# Patient Record
Sex: Male | Born: 1945
Health system: Southern US, Community
[De-identification: ages and names within clinical notes are randomized; demographics above are authoritative.]

## PROBLEM LIST (undated history)

## (undated) DIAGNOSIS — C61 Malignant neoplasm of prostate: Secondary | ICD-10-CM

## (undated) DIAGNOSIS — E7800 Pure hypercholesterolemia, unspecified: Secondary | ICD-10-CM

## (undated) HISTORY — DX: Pure hypercholesterolemia, unspecified: E78.00

## (undated) HISTORY — DX: Malignant neoplasm of prostate: C61

## (undated) HISTORY — PX: APPENDECTOMY: SHX54

---

## 2013-05-20 DIAGNOSIS — Z125 Encounter for screening for malignant neoplasm of prostate: Secondary | ICD-10-CM | POA: Diagnosis not present

## 2013-05-20 DIAGNOSIS — E781 Pure hyperglyceridemia: Secondary | ICD-10-CM | POA: Diagnosis not present

## 2013-05-20 DIAGNOSIS — E663 Overweight: Secondary | ICD-10-CM | POA: Diagnosis not present

## 2013-05-20 DIAGNOSIS — K219 Gastro-esophageal reflux disease without esophagitis: Secondary | ICD-10-CM | POA: Diagnosis not present

## 2013-06-04 DIAGNOSIS — Z125 Encounter for screening for malignant neoplasm of prostate: Secondary | ICD-10-CM | POA: Diagnosis not present

## 2013-06-04 DIAGNOSIS — K219 Gastro-esophageal reflux disease without esophagitis: Secondary | ICD-10-CM | POA: Diagnosis not present

## 2013-06-04 DIAGNOSIS — E669 Obesity, unspecified: Secondary | ICD-10-CM | POA: Diagnosis not present

## 2013-07-13 DIAGNOSIS — N529 Male erectile dysfunction, unspecified: Secondary | ICD-10-CM | POA: Diagnosis not present

## 2013-07-13 DIAGNOSIS — R972 Elevated prostate specific antigen [PSA]: Secondary | ICD-10-CM | POA: Diagnosis not present

## 2013-07-23 DIAGNOSIS — R972 Elevated prostate specific antigen [PSA]: Secondary | ICD-10-CM | POA: Diagnosis not present

## 2013-07-23 HISTORY — PX: OTHER SURGICAL HISTORY: SHX169

## 2013-08-02 ENCOUNTER — Other Ambulatory Visit (HOSPITAL_COMMUNITY): Payer: Self-pay | Admitting: Urology

## 2013-08-02 DIAGNOSIS — C61 Malignant neoplasm of prostate: Secondary | ICD-10-CM

## 2013-08-06 ENCOUNTER — Encounter (HOSPITAL_COMMUNITY): Payer: Self-pay

## 2013-08-06 ENCOUNTER — Ambulatory Visit (HOSPITAL_COMMUNITY)
Admission: RE | Admit: 2013-08-06 | Discharge: 2013-08-06 | Disposition: A | Discharge status: Home / Self Care | Payer: Medicare Other | Source: Ambulatory Visit | Attending: Urology | Admitting: Urology

## 2013-08-06 ENCOUNTER — Ambulatory Visit (HOSPITAL_COMMUNITY): Admission: RE | Admit: 2013-08-06 | Payer: Medicare Other | Source: Ambulatory Visit

## 2013-08-06 ENCOUNTER — Other Ambulatory Visit (HOSPITAL_COMMUNITY): Payer: Self-pay | Admitting: Urology

## 2013-08-06 DIAGNOSIS — M545 Low back pain, unspecified: Secondary | ICD-10-CM | POA: Diagnosis not present

## 2013-08-06 DIAGNOSIS — C61 Malignant neoplasm of prostate: Secondary | ICD-10-CM

## 2013-08-06 MED ORDER — TECHNETIUM TC 99M MEDRONATE IV KIT
25.0000 | PACK | Freq: Once | INTRAVENOUS | Status: AC | PRN
  Administered 2013-08-06: 25 via INTRAVENOUS

## 2013-08-16 ENCOUNTER — Other Ambulatory Visit (HOSPITAL_COMMUNITY): Payer: Medicare Other

## 2013-08-18 ENCOUNTER — Telehealth: Payer: Self-pay | Admitting: *Deleted

## 2013-08-18 NOTE — Telephone Encounter (Signed)
LVM for patient to call re: his referral from [facility] for the upcoming 9/12 prostate MDC.  Young Berry, RN, BSN, [facility] Prostate Oncology Navigator [phone]

## 2013-08-20 ENCOUNTER — Other Ambulatory Visit (HOSPITAL_COMMUNITY): Payer: Medicare Other

## 2013-08-20 ENCOUNTER — Ambulatory Visit (HOSPITAL_COMMUNITY)
Admission: RE | Admit: 2013-08-20 | Discharge: 2013-08-20 | Disposition: A | Discharge status: Home / Self Care | Payer: Medicare Other | Source: Ambulatory Visit | Attending: Urology | Admitting: Urology

## 2013-08-20 DIAGNOSIS — C61 Malignant neoplasm of prostate: Secondary | ICD-10-CM | POA: Insufficient documentation

## 2013-08-20 LAB — CREATININE, SERUM
Creatinine, Ser: 1.1 mg/dL (ref 0.50–1.35)
GFR calc non Af Amer: 68 mL/min — ABNORMAL LOW (ref >90)

## 2013-08-20 MED ORDER — GADOBENATE DIMEGLUMINE 529 MG/ML IV SOLN: 20.0000 mL | Freq: Once | INTRAVENOUS | Status: AC | PRN

## 2013-08-24 ENCOUNTER — Telehealth: Payer: Self-pay | Admitting: *Deleted

## 2013-08-24 NOTE — Telephone Encounter (Signed)
Called and spoke with patient re: [facility] referral for 9/12 Prostate MDC.  He indicated he could re-arrange his work schedule to attend.  I told him he would be receiving a mailing in the next couple of days with additional information re: the clinic and forms to be completed prior to his visit.   I encouraged him to call me with any questions prior to receiving or after receiving the information.  Young Berry, RN, BSN, [facility] Prostate Oncology Navigator [phone]

## 2013-08-26 DIAGNOSIS — C61 Malignant neoplasm of prostate: Secondary | ICD-10-CM | POA: Diagnosis not present

## 2013-09-02 ENCOUNTER — Encounter: Payer: Self-pay | Admitting: Radiation Oncology

## 2013-09-02 ENCOUNTER — Telehealth: Payer: Self-pay | Admitting: *Deleted

## 2013-09-02 DIAGNOSIS — C61 Malignant neoplasm of prostate: Secondary | ICD-10-CM | POA: Insufficient documentation

## 2013-09-02 NOTE — Progress Notes (Signed)
GU Location of Tumor / Histology: adenocarcinoma of the prostate  If Prostate Cancer, Gleason Score is (4 + 4=8) and PSA is (71.29)  Patient presented in May 2014 with an elevated PSA.  Biopsies of prostate (if applicable) revealed:     Past/Anticipated interventions by urology, if any: active surveillance not an option and surgery is not recommended therefore, patient was referred to Dr. Kathrynn Running by Dr. Su Grand.  Past/Anticipated interventions by medical oncology, if any: None   Weight changes, if any: None noted  Bowel/Bladder complaints, if any: urgency and erectile dysfunction   Nausea/Vomiting, if any: None noted  Pain issues, if any:  Back pain; increased activity noted on 08/20/2013 MRI suspicious for metastatic disease  SAFETY ISSUES:  Prior radiation? NO  Pacemaker/ICD? NO  Possible current pregnancy? NO  Is the patient on methotrexate? NO  Current Complaints / other details:  67 year old male. High volume carcinoma especially on the left. Findings highly suspicious for transcapsular spread and neurovascular involvement. Suspect a nodal metastasis within the periprosthetic fat. Prostate volume: 63.07.

## 2013-09-03 ENCOUNTER — Ambulatory Visit (HOSPITAL_BASED_OUTPATIENT_CLINIC_OR_DEPARTMENT_OTHER): Payer: Medicare Other | Admitting: Oncology

## 2013-09-03 ENCOUNTER — Encounter: Payer: Self-pay | Admitting: *Deleted

## 2013-09-03 ENCOUNTER — Ambulatory Visit
Admission: RE | Admit: 2013-09-03 | Discharge: 2013-09-03 | Disposition: A | Discharge status: Home / Self Care | Payer: Medicare Other | Source: Ambulatory Visit | Attending: Radiation Oncology | Admitting: Radiation Oncology

## 2013-09-03 ENCOUNTER — Encounter: Payer: Self-pay | Admitting: Radiation Oncology

## 2013-09-03 ENCOUNTER — Ambulatory Visit: Admission: RE | Admit: 2013-09-03 | Payer: Medicare Other | Source: Ambulatory Visit | Admitting: Radiation Oncology

## 2013-09-03 VITALS — BP 129/77 | HR 67 | Temp 98.7°F | Resp 18 | Ht 65.0 in | Wt 216.0 lb

## 2013-09-03 DIAGNOSIS — E7800 Pure hypercholesterolemia, unspecified: Secondary | ICD-10-CM | POA: Diagnosis not present

## 2013-09-03 DIAGNOSIS — C61 Malignant neoplasm of prostate: Secondary | ICD-10-CM

## 2013-09-03 DIAGNOSIS — M899 Disorder of bone, unspecified: Secondary | ICD-10-CM | POA: Diagnosis not present

## 2013-09-03 NOTE — Progress Notes (Signed)
Radiation Oncology         [phone]) [phone] ________________________________  Initial outpatient Consultation  Name: Benjamin Brown MRN: [id number]  Date: 09/03/2013  DOB: [birth year]  WJ:XBJY,NWGNFA Kirtland Bouchard, MD  Crecencio Mc, MD   REFERRING PHYSICIAN: Crecencio Mc, MD  DIAGNOSIS: 67 y.o. 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 gentleman.  He was noted to have an elevated PSA of 71.29 by his primary care physician, Dr. Loleta Chance.  Accordingly, he was referred for evaluation in urology by Dr. Brunilda Payor on 07/13/13,  digital rectal examination was performed at that time revealing induration of the entire left lobe of the gland.  The patient proceeded to transrectal ultrasound with 12 biopsies of the prostate on 07/23/13.  The prostate volume measured 63.07 cc.  Out of 12 core biopsies, 10 were positive.  The maximum Gleason score was 4+4.  Bone scan 8/15 showed some increased uptake at L4 which was non-specific.  MRI prostate on 8/29 showed bulky involvement of the prostate more on the left with extraprostatic extension.  The patient reviewed the biopsy results with his urologist and he has kindly been referred today for discussion of potential radiation treatment options.    The patient was referred for multidisciplinary conference and clinic today.  We reviewed his films and path.  PREVIOUS RADIATION THERAPY: No  PAST MEDICAL HISTORY:  has a past medical history of Prostate cancer and Pure hypercholesterolemia.    PAST SURGICAL HISTORY: Past Surgical History  Procedure Laterality Date  . Prostate needle biopsy  07/23/2013  . Appendectomy      FAMILY HISTORY: family history is negative for Cancer.  SOCIAL HISTORY:  reports that he quit smoking about 41 years ago. His smoking use included Cigarettes. He has a .5 pack-year smoking history. He has never used smokeless tobacco. He  reports that he does not drink alcohol or use illicit drugs.  ALLERGIES: Review of patient's allergies indicates no known allergies.  MEDICATIONS:  Current Outpatient Prescriptions  Medication Sig Dispense Refill  . levofloxacin (LEVAQUIN) 500 MG tablet Take 500 mg by mouth daily.      . sildenafil (VIAGRA) 50 MG tablet Take 50 mg by mouth daily as needed for erectile dysfunction.       No current facility-administered medications for this encounter.    REVIEW OF SYSTEMS:  A 15 point review of systems is documented in the electronic medical record. This was obtained by the nursing staff. However, I reviewed this with the patient to discuss relevant findings and make appropriate changes.  A comprehensive review of systems was negative except for: The patient has had chronic low back pain for years, but, this has not changed recently..  The patient completed an IPSS and IIEF questionnaire.  His IPSS score was 3 indicating mild urinary outflow obstructive symptoms.   PHYSICAL EXAM: This patient is in no acute distress.  He is alert and oriented.   height is 5\' 5"  (1.651 m) and weight is 216 lb (97.977 kg). His oral temperature is 98.7 F (37.1 C). His blood pressure is 129/77 and his pulse is 67. His respiration is 18 and oxygen saturation is 100%.  He exhibits no respiratory distress or labored breathing.  He appears neurologically intact.  His mood is pleasant.  His affect is appropriate.  Please note the digital rectal exam findings described above.  KPS = 100  RADIOGRAPHY: Nm Bone Scan Whole Body  08/06/2013   *RADIOLOGY REPORT*  Clinical Data: New diagnosis of prostate carcinoma.  Chronic lumbar back pain.  NUCLEAR MEDICINE WHOLE BODY BONE SCINTIGRAPHY  Technique:  Whole body anterior and posterior images were obtained approximately 3 hours after intravenous injection of radiopharmaceutical.  Radiopharmaceutical: CURIE TC-MDP TECHNETIUM TC 62M MEDRONATE IV KIT  Comparison: None.   Findings: There is patchy radiotracer accumulation in the L4 vertebra.  This may be degenerative.  Metastatic disease is not excluded.  There is degenerative uptake at the sternoclavicular joints, involving the knees, ankles and feet.  There are no other areas of radiotracer uptake to suggest metastatic disease.  Renal uptake is symmetric.  IMPRESSION: Patchy radiotracer accumulation in the L4 vertebra.  This is nonspecific.  It may be degenerative in origin, which is favored. Metastatic disease is not excluded.  There is no other evidence to suggest metastatic disease.  Recommend correlating the L4 findings with standard radiographs.   Original Report Authenticated By: Amie Portland, M.D.   Mr Prostate W / Wo Cm  08/20/2013   *RADIOLOGY REPORT*  Clinical Data:  New diagnosis of prostate cancer.  MR PROSTATE WITHOUT AND WITH CONTRAST  Technique:  Multiplanar multisequence MRI images were obtained of the pelvis, centered about the prostate, using both external and endorectal coils.  Pre and post contrast images were obtained.  Contrast:   19 ml Multihance  Comparison:  Bone scan 08/06/2013  FINDINGS:  Prostate: Central gland enlargement multiple nodules.  Dominant left paracentral nodule within the mid gland measures 2.4 cm on image 16/series 5.  This does demonstrate restricted diffusion, including on image 14/series 900.  A lateral right T2 hypointense nodule measures 9 mm on image 21/series 5. This demonstrates restricted diffusion on image 10/series 1000.  Equivocal early post contrast enhancement in its inferior most portion on image 55/series 11,202.  The second most suspicious area is within the left paracentral gland in its base to midportion.  This measures 1.1 cm on image 14/series 5.  Causes bulging of the prostatic capsule, including on image 14/series 7.  Corresponds to restricted diffusion on image 17/series 1000.  Early post contrast enhancement image 39/series 11,202.  A dominant lesion is  positioned within the left lateral gland in its mid to apical segments.  This is a large volume disease, measuring 3.0 x 1.2 cm on image 15/series 5 and 2.7 cm on image 9/series 8.  Also image 78/series 7.  This causes bulging and irregularity of the prostatic capsule, including image 14/series 5. This is suspicious for transcapsular disease.  The left neurovascular bundle is likely involved on image 15/series 6.  This area demonstrates restricted diffusion, including on image 14/series 900.  Early post contrast enhancement, including on image 43/series 11,201.  There is a 7 mm node at the 2 o'clock position adjacent the prostate on image 13/series 5.  This node appears to enhance similar to the adjacent involved gland, including on image 38/series 11,202.  Transcapsular spread:  Likely present at the lateral left mid and apical gland as detailed above.Possibly present in the left paracentral gland.  Seminal vesicle involvement:  Absent.  Neurovascular bundle involvement: Left-sided.  Pelvic adenopathy:  Suspicion of periprostatic nodal involvement. No pelvic sidewall adenopathy.  Bone metastasis:  Heterogeneous marrow signal.  More focal area of relative T1 hyperintensity within the left iliac on image 9/series 4 which is indeterminate. When correlated with the prior bone scan, metastatic disease cannot be excluded.  Other  findings:Normal urinary bladder and pelvic bowel loops.  IMPRESSION:  1.  High volume carcinoma, especially on the left.  Findings highly suspicious for transcapsular spread and neurovascular involvement. Suspect a nodal metastasis within the periprosthetic fat. 2.  Heterogeneous marrow signal.  When combined with the bone scan findings of increased activity at L5, metastatic disease is a concern.  At minimum, plain films of the lumbar spine should be considered.  Alternatively, lumbar spine MRI may be informative. 3.  Central gland nodules, primarily felt to represent benign prostatic  hyperplasia.  Dominant left paracentral nodule demonstrates restricted diffusion.  Central gland carcinoma cannot be entirely excluded.   Original Report Authenticated By: Jeronimo Greaves, M.D.      IMPRESSION: This gentleman is a 67 y.o. 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  He has a suspicious finding at L4 representing possible metastatic disease versus degenerative disease.  He would benefit from lumbar MRI to rule out metastatic deposits at L3.  If his MRI shows no mets, His T-Stage, Gleason's Score, and PSA put him into the high risk group.  Accordingly he is eligible for a variety of potential treatment options including prostatectomy or androgen deprivation with radiotherapy.  PLAN:Today I reviewed the findings and workup thus far.    We discussed the need for lumbar MRI, and we need to re-group after that study to decide on options.  I spent the rest of our time discussing how radiation might be used and those details.  We discussed the natural history of prostate cancer.  We reviewed the the implications of T-stage, Gleason's Score, and PSA on decision-making and outcomes in prostate cancer.  We discussed radiation treatment in the management of prostate cancer with regard to the logistics and delivery of external beam radiation treatment as well as the logistics and delivery of prostate brachytherapy.  We compared and contrasted each of these approaches and also compared these against prostatectomy.  The patient expressed interest in external beam radiotherapy.  I filled out a patient counseling form for him with relevant treatment diagrams and we retained a copy for our records.   I enjoyed meeting with him today, and will look forward to participating in the care of this very nice gentleman.    I spent 60 minutes face to face with the patient and more than 50% of that time was spent in counseling and/or coordination of  care.  Lumbar MRI next Friday then discuss at next prostate multidisciplinary conference, then see me in follow-up a few days later.   I think of the lumbar MRI as leaving [place] with one of at least 3 possible scenarios:  1. Negative for metastatic disease showing degenerative disease at L4 to explain the bone scan  -  Consider androgen deprivation for 2-3 years with IMRT to the prostate/SV/pelvic nodes  2. Positive for solitary metastatic deposit at L4 representing oligometastatic disease  -  Consider androgen deprivation for 2-3 years with IMRT to the prostate/SV/pelvic nodes  -  Consider Stereotactic Radiosurgery to L4 for definitive control  3. Positive for multiple deposits of metastatic disease at L4 and other vertebral bodies  -  Consider androgen deprivation and systemic treatment options  -  Reserve radiation for palliation  ------------------------------------------------  Artist Pais. Kathrynn Running, M.D.

## 2013-09-03 NOTE — Consult Note (Signed)
Reason for Referral: Prostate cancer.   HPI: Dr. Grotz is a pleasant 67 year old gentleman currently resides in [facility] [place]. He currently practices as a primary care physician. He is in relatively good health and shape without really any significant comorbid conditions. He was found to have an elevated PSA of 71.29 by his primary care physician and was referred to [facility] urology specialists as evaluated by Dr. Brunilda Payor on 07/13/2013. He does have occasional urgency but really no voiding symptoms. On 07/23/2013 he underwent a biopsy which showed 10/12 cores to be positive with adenocarcinoma Gleason score of 3+3 in 2 cores, 3+4 in 4 cores, 4+3-3/4 and 4+4-1/4. He underwent a bone scan on 08/06/2013 which showed a patchy radiotracer accumulation in the L4 vertebral body. He also underwent a MRI of the prostate showed high volume carcinoma especially on the left highly suspicious for transcapsular spread and neurovascular involvement. Patient was referred to the prostate cancer multidisciplinary clinic at the [place].  Clinically, he is relatively asymptomatic he is not reporting any constitutional symptoms of weight loss or appetite changes. Does not report any fatigue or tiredness. He has very little urinary symptoms and no sexual dysfunction symptoms. He is not reporting any back pain or shoulder pain. Is not reporting any weakness in his upper or lower extremities. Stat report any change in his bowel habits did not report any bleeding or clotting tendencies. His continued to practice full-time medicine.   Past Medical History  Diagnosis Date  . Prostate cancer     adenocarcinoma of the prostate gland  . Pure hypercholesterolemia   :  Past Surgical History  Procedure Laterality Date  . Prostate needle biopsy  07/23/2013  . Appendectomy    :  Current outpatient prescriptions:levofloxacin (LEVAQUIN) 500 MG tablet, Take 500 mg by mouth daily., Disp: , Rfl: ;   sildenafil (VIAGRA) 50 MG tablet, Take 50 mg by mouth daily as needed for erectile dysfunction., Disp: , Rfl: :  No Known Allergies:  Family History  Problem Relation Age of Onset  . Cancer Neg Hx   :  History   Social History  . Marital Status: Married    Spouse Name: N/A    Number of Children: N/A  . Years of Education: N/A   Occupational History  . Not on file.   Social History Main Topics  . Smoking status: Former Smoker -- 0.50 packs/day for 1 years    Types: Cigarettes    Quit date: 12/24/1971  . Smokeless tobacco: Never Used  . Alcohol Use: No  . Drug Use: No  . Sexual Activity: Yes   Other Topics Concern  . Not on file   Social History Narrative  . No narrative on file  :  A comprehensive review of systems was negative.  Exam: ECOG 0 There were no vitals taken for this visit. General appearance: alert, cooperative and appears stated age Head: Normocephalic, without obvious abnormality, atraumatic Nose: Nares normal. Septum midline. Mucosa normal. No drainage or sinus tenderness. Throat: lips, mucosa, and tongue normal; teeth and gums normal Neck: no adenopathy, no carotid bruit, no JVD, supple, symmetrical, trachea midline and thyroid not enlarged, symmetric, no tenderness/mass/nodules Back: negative Resp: clear to auscultation bilaterally Cardio: regular rate and rhythm, S1, S2 normal, no murmur, click, rub or gallop GI: soft, non-tender; bowel sounds normal; no masses,  no organomegaly Extremities: extremities normal, atraumatic, no cyanosis or edema Pulses: 2+ and symmetric Lymph nodes: Cervical, supraclavicular, and axillary nodes normal. Neurologic: Grossly  normal    Nm Bone Scan Whole Body  08/06/2013   *RADIOLOGY REPORT*  Clinical Data: New diagnosis of prostate carcinoma.  Chronic lumbar back pain.  NUCLEAR MEDICINE WHOLE BODY BONE SCINTIGRAPHY  Technique:  Whole body anterior and posterior images were obtained approximately 3 hours after  intravenous injection of radiopharmaceutical.  Radiopharmaceutical: CURIE TC-MDP TECHNETIUM TC 38M MEDRONATE IV KIT  Comparison: None.  Findings: There is patchy radiotracer accumulation in the L4 vertebra.  This may be degenerative.  Metastatic disease is not excluded.  There is degenerative uptake at the sternoclavicular joints, involving the knees, ankles and feet.  There are no other areas of radiotracer uptake to suggest metastatic disease.  Renal uptake is symmetric.  IMPRESSION: Patchy radiotracer accumulation in the L4 vertebra.  This is nonspecific.  It may be degenerative in origin, which is favored. Metastatic disease is not excluded.  There is no other evidence to suggest metastatic disease.  Recommend correlating the L4 findings with standard radiographs.   Original Report Authenticated By: Amie Portland, M.D.   Mr Prostate W / Wo Cm  08/20/2013   *RADIOLOGY REPORT*  Clinical Data:  New diagnosis of prostate cancer.  MR PROSTATE WITHOUT AND WITH CONTRAST  Technique:  Multiplanar multisequence MRI images were obtained of the pelvis, centered about the prostate, using both external and endorectal coils.  Pre and post contrast images were obtained.  Contrast:   19 ml Multihance  Comparison:  Bone scan 08/06/2013  FINDINGS:  Prostate: Central gland enlargement multiple nodules.  Dominant left paracentral nodule within the mid gland measures 2.4 cm on image 16/series 5.  This does demonstrate restricted diffusion, including on image 14/series 900.  A lateral right T2 hypointense nodule measures 9 mm on image 21/series 5. This demonstrates restricted diffusion on image 10/series 1000.  Equivocal early post contrast enhancement in its inferior most portion on image 55/series 11,202.  The second most suspicious area is within the left paracentral gland in its base to midportion.  This measures 1.1 cm on image 14/series 5.  Causes bulging of the prostatic capsule, including on image 14/series 7.   Corresponds to restricted diffusion on image 17/series 1000.  Early post contrast enhancement image 39/series 11,202.  A dominant lesion is positioned within the left lateral gland in its mid to apical segments.  This is a large volume disease, measuring 3.0 x 1.2 cm on image 15/series 5 and 2.7 cm on image 9/series 8.  Also image 78/series 7.  This causes bulging and irregularity of the prostatic capsule, including image 14/series 5. This is suspicious for transcapsular disease.  The left neurovascular bundle is likely involved on image 15/series 6.  This area demonstrates restricted diffusion, including on image 14/series 900.  Early post contrast enhancement, including on image 43/series 11,201.  There is a 7 mm node at the 2 o'clock position adjacent the prostate on image 13/series 5.  This node appears to enhance similar to the adjacent involved gland, including on image 38/series 11,202.  Transcapsular spread:  Likely present at the lateral left mid and apical gland as detailed above.Possibly present in the left paracentral gland.  Seminal vesicle involvement:  Absent.  Neurovascular bundle involvement: Left-sided.  Pelvic adenopathy:  Suspicion of periprostatic nodal involvement. No pelvic sidewall adenopathy.  Bone metastasis:  Heterogeneous marrow signal.  More focal area of relative T1 hyperintensity within the left iliac on image 9/series 4 which is indeterminate. When correlated with the prior bone scan, metastatic disease cannot be  excluded.  Other findings:Normal urinary bladder and pelvic bowel loops.  IMPRESSION:  1.  High volume carcinoma, especially on the left.  Findings highly suspicious for transcapsular spread and neurovascular involvement. Suspect a nodal metastasis within the periprosthetic fat. 2.  Heterogeneous marrow signal.  When combined with the bone scan findings of increased activity at L5, metastatic disease is a concern.  At minimum, plain films of the lumbar spine should be  considered.  Alternatively, lumbar spine MRI may be informative. 3.  Central gland nodules, primarily felt to represent benign prostatic hyperplasia.  Dominant left paracentral nodule demonstrates restricted diffusion.  Central gland carcinoma cannot be entirely excluded.   Original Report Authenticated By: Jeronimo Greaves, M.D.    Assessment and Plan:   67 year old gentleman with locally advanced prostate adenocarcinoma and possibly has metastatic disease. He presented with a Gleason score 4+4 equals 8 and a PSA 71.29 with some cores indicating 4+3 equals 7 and 3+4 equals 7 element as well. He has high volume disease based on 10/12 cores involved as well as his pelvic MRI. His bone scan also suggestive of an L4 involvement makes him likely to have stage IV disease.  His case was discussed and the prostate cancer multidisciplinary clinic, his pathology was reviewed with the reviewing pathologists, imaging studies were also reviewed with radiology and the case was discussed with Drs. Kathrynn Running and [employer] as part of a multidisciplinary clinic. The general consensus at this point feels that he has high-risk disease and have potentially have already developed metastatic disease. To work this up, we have suggested an MRI of the lumbar spine to be done with and without contrast to evaluate the sclerotic lesion at L4. If he has prostate cancer involvement in that area then he has stage IV disease and will require androgen depravation with consideration of salvage radiation therapy down the line. If his L4 disease is not suggestive of metastatic disease than we are probably dealing with locally advanced disease and he will require androgen deprivation plus radiation at that time. I discussed these findings with him as well I also discussed with him the implications of using androgen deprivation short-term and long-term. I counseled him about the risks associated with this maneuver and complications that includes hot  flashes, osteoporosis and possible cardiovascular disease.  The plan at this point after his MRI have been completed his case will be presented again at the GU tumor board and depending on these findings will dictate his therapy and followup.  I see no role for systemic chemotherapy at this point or immunotherapy either but certainly down the line he is high risk of developing advanced disease and he would be an excellent candidate for any of these therapies.

## 2013-09-03 NOTE — Addendum Note (Signed)
Encounter addended by: Crecencio Mc, MD on: 09/03/2013 11:19 AM<BR>     Documentation filed: Clinical Notes

## 2013-09-03 NOTE — Progress Notes (Addendum)
[facility]state [facility] Clinical Social Work          CSW met with Pt at clinic to check in and assess for psychosocial needs. CSW reviewed distress screen with Pt and Pt scored a 1 today which indicates no distress currently. Pt was accompanied by his wife who appears supportive. Pt did express his main concern was being able to attend to his work responsibilities. CSW provided Pt and his wife [facility] support resources and calendar. Pt and wife agree to seek assistance as needed.   Doreen Salvage, LCSW Clinical Social Worker [facility] Wednesday, Thursday and Friday Phone: [phone] Fax: [phone]

## 2013-09-03 NOTE — Progress Notes (Signed)
Completed paperwork provided to each physician. Chart updated accordingly. Vitals stable. Patient denies pain at this time. Reports only transient blood in urine following biopsy which resolved.

## 2013-09-03 NOTE — Progress Notes (Signed)
Please see consult note.  

## 2013-09-03 NOTE — Consult Note (Signed)
Chief Complaint  Prostate Cancer   Reason For Visit  Multidisciplinary Prostate Cancer Clinic Reason for consult: To discuss management options for prostate cancer Physician requesting consult: Dr. Su Grand PCP: Dr. 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  This was repeated in June and remained elevated at 71.3.  He was seen by Dr. Brunilda Payor and his DRE was very suspicious with induration involving the entire left lobe. 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 He underwent staging studies including a bone scan (08/06/13).  The initial radiology report indicated that there was no concern for metastatic disease to the bone.  However, his bone scan was reviewed in our multidisciplinary conference this morning and there was significant concern about his L4 vertebrae possibly representing metastatic disease.  An MRI of the prostate was performed on 08/20/13 and indicated multiple nodules suspicious for malignancy including multiple areas on the left side of the prostate with the dominant nodule measuring about 3.0 cm and evidence of extraprostatic extension on the left with probable neurovascular bundle involvement. He did have a 7 mm but enhancing periprostatic lymph node which was concerning for malignancy but no pelvic lymphadenopathy.  There was one small left iliac lesion which was indeterminate but possibly concerning for metastasis.   He has no family history of prostate cancer. He has no medical comorbid conditions except for dyslipidemia.  TNM stage: cT3a N0 M0-1 PSA: 71.3 Gleason score: 4+4=8 Biopsy (07/23/13): 10/12 cores positive   Left: L lateral apex (70%, 3+4=7), L apex (80%, 3+4=7), L lateral mid (60%, 4+3=7, PNI), L mid (90%, 4+3=7), L lateral base (80%, 4+3=7), L base (80%,  4+4=8)   Right: R mid (90%, 3+4=7, PNI), R lateral mid (5%, 3+3=6), R base (40%, 3+4=7), R lateral base (<5%, 3+3=6) Prostate volume: 63 cc  Nomogram OC disease: 3% EPE: N/A SVI: 73% LNI: N/A PFS (surgery): 8% at 5 years, 2% at 10 years  Urinary function: He has minimal baseline voiding symptoms.  IPSS is 3. 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  SHIM score is 11.   Past Medical History Problems  1. History of  Hypercholesterolemia 272.0  Surgical History Problems  1. History of  Appendectomy  Current Meds 1. Viagra 50 MG Oral Tablet; Take 1 tablet as needed; Therapy: 22Jul2014 to (Evaluate:18Jan2015)   Requested for: 22Jul2014; Last Rx:22Jul2014  Allergies Medication  1. No Known Drug Allergies  Family History Problems  1. Family history of  Diabetes Mellitus V18.0 2. Family history of  Family Health Status - Father's Age 35 3. Family history of  Hypertension V17.49 4. 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    Physical Exam Constitutional: Well nourished and well developed . No acute distress.    Results/Data  I have independently reviewed his medical records, PSA results, pathology report, and bone scan  and MRI.  These studies were also reviewed at the multidisciplinary conference today.  Finds are as stated above.     Assessment Assessed  1. Adenocarcinoma Of The Prostate Gland 185  Discussion/Summary  1.  Prostate cancer: I saw Dr. Katrinka Blazing in the  multidisciplinary conference today after he had talked with Dr. Kathrynn Running and Dr. Clelia Croft.  It has been explained that there is concern for possible metastatic disease and he will be scheduled to undergo an MRI of the lumbar spine for further evaluation considering his bone scan findings.  Considering the concern for metastatic disease but also well as the fact that he at least has significant high risk locally advanced disease, I did not recommend primary surgical therapy as it would have a very low risk for providing care for having a significant impact on his long-term prognosis.  I discussed with Dr. Katrinka Blazing that I agree with Dr. Kathrynn Running and Dr. Alver Fisher recommendations to proceed with an MRI of the lumbar spine with further recommendations pending this evaluation.  If he does not have metastatic disease, he likely would be best served with a combination of androgen deprivation therapy and external beam radiation.  If he does have metastatic disease, standard therapy would include systemic androgen deprivation therapy.  Dr. Kathrynn Running will plan to follow-up on his MRI findings and will review his MRI with Dr. Katrinka Blazing once completed.   A total of 30 minutes were spent in the overall care of the patient today with 20 minutes in direct face to face consultation.    Amendment  CC: Dr. Su Grand  Amended By: Heloise Purpura; 09/03/2013 10:51 AMEST   Signatures Electronically signed by : Heloise Purpura, M.D.; Sep 03 2013 10:51AM

## 2013-09-04 NOTE — Progress Notes (Signed)
I met with patient at the end of his Prostate MDC visit, further explained my role as his Oncology Nurse Navigator, and provided him the following Care Plan:    Prostate [facility]   Care Plan Summary  Name:  Dr. Eduardo Osier DOB:    11/25/1946  Your Medical Team:   Urologist - Dr. Heloise Purpura, [facility]  Radiation Oncologist - Dr. Margaretmary Dys  Medical Oncologist - Dr. Eli Hose  Your Diagnosis:   Clinical Stage - T3  Recommendations: 1)  MRI of spine to assess presence of metastatic disease 2)  Possible radiation depending on results of MRI.  * These recommendations are based on information available as of today's consult.      Recommendations may change depending on the results of further tests or exams.  Next Steps: 1)   MRI - scheduled for 09/10/13 at 6:45 pm at [facility]  When appointments need to be scheduled, you will be contacted by [facility] and/or [facility].  Questions? Please do not hesitate to call Young Berry, RN, BSN, [facility] at [phone] with any questions or concerns.  Raiford Noble is Radio broadcast assistant and is available to assist you while you're receiving your medical care at [place].   Young Berry, RN, BSN, [facility] Prostate Oncology Navigator [phone]

## 2013-09-04 NOTE — Telephone Encounter (Signed)
Called patient to see if he had any questions prior to his attendance at tomorrow's Prostate MDC.  He indicated he had none.  I asked him to be at [facility] by 0745 to facilitate Registration.  Young Berry, RN, BSN, [facility] Prostate Oncology Navigator [phone]

## 2013-09-10 ENCOUNTER — Other Ambulatory Visit: Payer: Self-pay | Admitting: Radiation Oncology

## 2013-09-10 ENCOUNTER — Ambulatory Visit (HOSPITAL_COMMUNITY)
Admission: RE | Admit: 2013-09-10 | Discharge: 2013-09-10 | Disposition: A | Discharge status: Home / Self Care | Payer: Medicare Other | Source: Ambulatory Visit | Attending: Radiation Oncology | Admitting: Radiation Oncology

## 2013-09-10 DIAGNOSIS — C61 Malignant neoplasm of prostate: Secondary | ICD-10-CM

## 2013-09-10 DIAGNOSIS — M5137 Other intervertebral disc degeneration, lumbosacral region: Secondary | ICD-10-CM | POA: Diagnosis not present

## 2013-09-10 DIAGNOSIS — C7951 Secondary malignant neoplasm of bone: Secondary | ICD-10-CM | POA: Diagnosis not present

## 2013-09-10 DIAGNOSIS — M51379 Other intervertebral disc degeneration, lumbosacral region without mention of lumbar back pain or lower extremity pain: Secondary | ICD-10-CM | POA: Insufficient documentation

## 2013-09-10 MED ORDER — GADOBENATE DIMEGLUMINE 529 MG/ML IV SOLN
20.0000 mL | Freq: Once | INTRAVENOUS | Status: AC | PRN
  Administered 2013-09-10: 20 mL via INTRAVENOUS

## 2013-09-16 ENCOUNTER — Telehealth: Payer: Self-pay | Admitting: Oncology

## 2013-09-16 NOTE — Telephone Encounter (Signed)
s.w. pt and advised on 10.7.14 appt @ 11:30 per Dr. Clelia Croft

## 2013-09-17 DIAGNOSIS — C61 Malignant neoplasm of prostate: Secondary | ICD-10-CM | POA: Diagnosis not present

## 2013-09-28 ENCOUNTER — Ambulatory Visit (HOSPITAL_BASED_OUTPATIENT_CLINIC_OR_DEPARTMENT_OTHER): Payer: Medicare Other | Admitting: Oncology

## 2013-09-28 ENCOUNTER — Encounter: Payer: Self-pay | Admitting: *Deleted

## 2013-09-28 VITALS — BP 141/75 | HR 68 | Temp 97.8°F | Resp 20 | Ht 65.0 in | Wt 216.0 lb

## 2013-09-28 DIAGNOSIS — E291 Testicular hypofunction: Secondary | ICD-10-CM

## 2013-09-28 DIAGNOSIS — C61 Malignant neoplasm of prostate: Secondary | ICD-10-CM

## 2013-09-28 DIAGNOSIS — C7951 Secondary malignant neoplasm of bone: Secondary | ICD-10-CM

## 2013-09-28 NOTE — Progress Notes (Signed)
Met with pt during scheduled appt with Dr. Clelia Croft.  Pt reviewed with me his current hormonal therapy and explained further chemotherapy option.  He indicated that he and his wife have not mentioned his diagnosis to their 3 children as of yet.  I accompanied pt to lobby where I met with his wife again.  Will continue to follow.    Young Berry, RN, BSN, [facility] Prostate Oncology Navigator [phone]

## 2013-09-28 NOTE — Progress Notes (Signed)
Hematology and Oncology Follow Up Visit  Benjamin Brown [id number] 07-27-1946 67 y.o. 09/28/2013 11:47 AM Evlyn Courier, MDHill, Earvin Hansen, MD   Principle Diagnosis: 67 year old gentleman diagnosed with prostate cancer Gleason score 4+4 equals 8 and a PSA 71.9 diagnosed in August of 2014. 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   Current therapy: He initiated hormonal therapy under the care of Dr. Brunilda Payor.   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 He is a pleasant gentleman I may evaluate him for the second time after initial consultation as a part of the prostate cancer multidisciplinary clinic. As mentioned, his staging workup revealed an L4 vertebral body metastasis and his MRI on 09/10/2013 showed a heterogeneous signal intensity involving the L4 vertebral body diffusely with extension into the bilateral pedicles and laminae consistent with metastatic disease. There is no evidence of epidural tumor involvement. Patient started with hormonal therapy on to Dr. Brunilda Payor and is here to discuss the role of chemotherapy. He is completely asymptomatic at this point. Is not reporting any back pain or shoulder pain. Not reporting any neurological symptoms.  Medications: I have reviewed the patient's current medications.  Current Outpatient Prescriptions  Medication Sig Dispense Refill  . sildenafil (VIAGRA) 50 MG tablet Take 50 mg by mouth daily as needed for erectile dysfunction.       No current facility-administered medications for this visit.     Allergies: No Known Allergies  Past Medical History, Surgical history, Social history, and Family History were reviewed and updated.  Review of Systems:  Remaining ROS negative. Physical Exam: Blood pressure 141/75, pulse 68, temperature 97.8 F (36.6 C), temperature source Oral, resp. rate 20, height 5\' 5"  (1.651 m), weight 216 lb (97.977 kg). ECOG: 0  Lab Results: No results found  for this basename: WBC, HGB, HCT, MCV, PLT,  PSA     Chemistry      Component Value Date/Time   CREATININE 1.10 08/20/2013 0833   No results found for this basename: CALCIUM, ALKPHOS, AST, ALT, BILITOT       Radiological Studies: IMPRESSION:  1. Heterogeneous signal intensity involving the L4 vertebral body  diffusely with extension into the bilateral pedicles and lamina,  most consistent with osseous metastasis. No epidural tumor  identified.  2. Additional heterogeneous signal intensity within the visualized  iliac wings and sacrum, also worrisome for osseous metastatic  disease.  3. Mild degenerative disc disease at L4-5 and L5-S1.   Impression and Plan:  67 year old gentleman with the following issues:  1. Prostate cancer diagnosed in August of 2014 presented with a PSA 71 and a Gleason score of 4+4 equals 8. His staging workup revealed an L4 vertebral body metastasis and recently started on hormonal deprivation. He has tolerated relatively well without any major complications. I've discussed today the natural course of advanced prostate cancer as well as the treatment options. I discussed with him the role of biochemical castration and the complications associated with it I've also discussed with him the role of systemic chemotherapy as presented with the CHAARTED trial.  The risks and benefits of adding systemic chemotherapy in the form of Taxotere 75 mg per meter square every 3 weeks for 6 cycles within 4 months of inception of androgen depravation discussed today in details. Complication associated with that includes nausea, vomiting, myelosuppression, neutropenia, peripheral neuropathy among other complications was discussed today in detail. I've also discussed with him the median survival benefit about 57.6 months and  that benefit was especially prominent in the high-volume disease patients.  He is weighing his options at this point whether to proceed with systemic  chemotherapy now or use it as a sequential mode after he becomes castration resistant. Written information about the study was given today and he will let me know in the near future.  2. Androgen deprivation: I will continue under the care of Dr. Brunilda Payor.   3. Bone directed therapy: He will probably benefit from [facility] as well.   Total time spent on today's visit was 30 minutes more than 50% of the time spent counseling and discussing treatment of prostate cancer.  [facility], MD 10/7/201411:47 AM

## 2013-10-22 DIAGNOSIS — C61 Malignant neoplasm of prostate: Secondary | ICD-10-CM | POA: Diagnosis not present

## 2014-02-09 ENCOUNTER — Encounter: Payer: Self-pay | Admitting: *Deleted

## 2014-02-25 DIAGNOSIS — C61 Malignant neoplasm of prostate: Secondary | ICD-10-CM | POA: Diagnosis not present

## 2014-07-01 DIAGNOSIS — C61 Malignant neoplasm of prostate: Secondary | ICD-10-CM | POA: Diagnosis not present

## 2014-10-28 DIAGNOSIS — C61 Malignant neoplasm of prostate: Secondary | ICD-10-CM | POA: Diagnosis not present

## 2014-10-28 DIAGNOSIS — C7951 Secondary malignant neoplasm of bone: Secondary | ICD-10-CM | POA: Diagnosis not present

## 2015-02-24 DIAGNOSIS — C61 Malignant neoplasm of prostate: Secondary | ICD-10-CM | POA: Diagnosis not present

## 2015-03-03 DIAGNOSIS — C61 Malignant neoplasm of prostate: Secondary | ICD-10-CM | POA: Diagnosis not present

## 2015-03-03 DIAGNOSIS — C7951 Secondary malignant neoplasm of bone: Secondary | ICD-10-CM | POA: Diagnosis not present

## 2015-07-07 DIAGNOSIS — C61 Malignant neoplasm of prostate: Secondary | ICD-10-CM | POA: Diagnosis not present

## 2015-10-20 DIAGNOSIS — C7951 Secondary malignant neoplasm of bone: Secondary | ICD-10-CM | POA: Diagnosis not present

## 2015-10-20 DIAGNOSIS — R3121 Asymptomatic microscopic hematuria: Secondary | ICD-10-CM | POA: Diagnosis not present

## 2015-10-20 DIAGNOSIS — C61 Malignant neoplasm of prostate: Secondary | ICD-10-CM | POA: Diagnosis not present

## 2015-11-09 DIAGNOSIS — C61 Malignant neoplasm of prostate: Secondary | ICD-10-CM | POA: Diagnosis not present

## 2015-11-09 DIAGNOSIS — R3121 Asymptomatic microscopic hematuria: Secondary | ICD-10-CM | POA: Diagnosis not present

## 2016-03-15 DIAGNOSIS — C61 Malignant neoplasm of prostate: Secondary | ICD-10-CM | POA: Diagnosis not present

## 2016-03-22 DIAGNOSIS — C7951 Secondary malignant neoplasm of bone: Secondary | ICD-10-CM | POA: Diagnosis not present

## 2016-03-22 DIAGNOSIS — C61 Malignant neoplasm of prostate: Secondary | ICD-10-CM | POA: Diagnosis not present

## 2016-03-22 DIAGNOSIS — Z0000 Encounter for general adult medical examination without abnormal findings: Secondary | ICD-10-CM | POA: Diagnosis not present

## 2016-03-22 DIAGNOSIS — R3121 Asymptomatic microscopic hematuria: Secondary | ICD-10-CM | POA: Diagnosis not present

## 2016-03-22 DIAGNOSIS — Z5111 Encounter for antineoplastic chemotherapy: Secondary | ICD-10-CM | POA: Diagnosis not present

## 2016-08-07 DIAGNOSIS — C7951 Secondary malignant neoplasm of bone: Secondary | ICD-10-CM | POA: Diagnosis not present

## 2016-08-07 DIAGNOSIS — C61 Malignant neoplasm of prostate: Secondary | ICD-10-CM | POA: Diagnosis not present

## 2016-08-21 ENCOUNTER — Other Ambulatory Visit: Payer: Self-pay

## 2016-09-23 DIAGNOSIS — J069 Acute upper respiratory infection, unspecified: Secondary | ICD-10-CM | POA: Diagnosis not present

## 2016-11-28 ENCOUNTER — Other Ambulatory Visit: Payer: Self-pay | Admitting: Urology

## 2016-11-28 DIAGNOSIS — C61 Malignant neoplasm of prostate: Secondary | ICD-10-CM

## 2016-11-28 DIAGNOSIS — Z79899 Other long term (current) drug therapy: Secondary | ICD-10-CM

## 2016-12-17 ENCOUNTER — Ambulatory Visit
Admission: RE | Admit: 2016-12-17 | Discharge: 2016-12-17 | Disposition: A | Discharge status: Home / Self Care | Payer: Medicare Other | Source: Ambulatory Visit | Attending: Urology | Admitting: Urology

## 2016-12-17 DIAGNOSIS — C61 Malignant neoplasm of prostate: Secondary | ICD-10-CM

## 2016-12-17 DIAGNOSIS — Z13820 Encounter for screening for osteoporosis: Secondary | ICD-10-CM | POA: Diagnosis not present

## 2016-12-17 DIAGNOSIS — Z79899 Other long term (current) drug therapy: Secondary | ICD-10-CM

## 2016-12-20 DIAGNOSIS — C61 Malignant neoplasm of prostate: Secondary | ICD-10-CM | POA: Diagnosis not present

## 2016-12-27 DIAGNOSIS — C61 Malignant neoplasm of prostate: Secondary | ICD-10-CM | POA: Diagnosis not present

## 2017-05-09 DIAGNOSIS — C61 Malignant neoplasm of prostate: Secondary | ICD-10-CM | POA: Diagnosis not present

## 2017-05-16 ENCOUNTER — Other Ambulatory Visit (HOSPITAL_COMMUNITY): Payer: Self-pay | Admitting: Urology

## 2017-05-16 DIAGNOSIS — C7951 Secondary malignant neoplasm of bone: Secondary | ICD-10-CM | POA: Diagnosis not present

## 2017-05-16 DIAGNOSIS — C61 Malignant neoplasm of prostate: Secondary | ICD-10-CM | POA: Diagnosis not present

## 2017-05-22 ENCOUNTER — Encounter: Payer: Self-pay | Admitting: Oncology

## 2017-05-22 ENCOUNTER — Telehealth: Payer: Self-pay | Admitting: Oncology

## 2017-05-22 NOTE — Telephone Encounter (Signed)
Appt has been rescheduled to 6/22 at 2pm

## 2017-06-09 ENCOUNTER — Encounter (HOSPITAL_COMMUNITY)
Admission: RE | Admit: 2017-06-09 | Discharge: 2017-06-09 | Disposition: A | Discharge status: Home / Self Care | Payer: Medicare Other | Source: Ambulatory Visit | Attending: Urology | Admitting: Urology

## 2017-06-09 DIAGNOSIS — C61 Malignant neoplasm of prostate: Secondary | ICD-10-CM | POA: Insufficient documentation

## 2017-06-09 DIAGNOSIS — C7951 Secondary malignant neoplasm of bone: Secondary | ICD-10-CM | POA: Diagnosis not present

## 2017-06-09 MED ORDER — TECHNETIUM TC 99M MEDRONATE IV KIT
21.6000 | PACK | Freq: Once | INTRAVENOUS | Status: AC | PRN
  Administered 2017-06-09: 21.6 via INTRAVENOUS

## 2017-06-10 ENCOUNTER — Ambulatory Visit: Payer: Medicare Other | Admitting: Oncology

## 2017-06-13 ENCOUNTER — Telehealth: Payer: Self-pay | Admitting: Oncology

## 2017-06-13 ENCOUNTER — Ambulatory Visit (HOSPITAL_BASED_OUTPATIENT_CLINIC_OR_DEPARTMENT_OTHER): Payer: Medicare Other | Admitting: Oncology

## 2017-06-13 VITALS — BP 137/75 | HR 73 | Temp 98.7°F | Resp 18 | Ht 65.0 in | Wt 236.3 lb

## 2017-06-13 DIAGNOSIS — G8929 Other chronic pain: Secondary | ICD-10-CM

## 2017-06-13 DIAGNOSIS — C61 Malignant neoplasm of prostate: Secondary | ICD-10-CM

## 2017-06-13 DIAGNOSIS — M545 Low back pain: Secondary | ICD-10-CM | POA: Diagnosis not present

## 2017-06-13 DIAGNOSIS — R635 Abnormal weight gain: Secondary | ICD-10-CM

## 2017-06-13 DIAGNOSIS — E291 Testicular hypofunction: Secondary | ICD-10-CM

## 2017-06-13 DIAGNOSIS — R5383 Other fatigue: Secondary | ICD-10-CM

## 2017-06-13 DIAGNOSIS — C7951 Secondary malignant neoplasm of bone: Secondary | ICD-10-CM | POA: Diagnosis not present

## 2017-06-13 NOTE — Progress Notes (Signed)
Hematology and Oncology Follow Up Visit  Benjamin Brown [id number] 08-29-1946 71 y.o. 06/13/2017 2:08 PM [name], MDHill, Berneta Sages, MD   Principle Diagnosis: 71 year old gentleman diagnosed with prostate cancer Gleason score 4+4 equals 8 and a PSA 71.9 diagnosed in August of 2014. 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   Current therapy:   Lupron 30 mg injection every 4 months given under the care of Dr. Alinda Money since 2014.  Interim History:  [name] presents today for a followup visit. He is a pleasant gentleman & consultation in 2014 for advanced prostate cancer. Since that time, he has been receiving Lupron under the care of Dr. Alinda Money with excellent response in his PSA. His PSA 2015 was 0.73 and in 2016 was 0.74. In December 2017 was 1.19 and on May 2018 was 2.04. 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 He underwent a bone scan on 06/09/2017 which showed increased L4 activity but no other areas of disease.  Clinically, he is asymptomatic otherwise. He does report intermittent back pain which has been chronic in nature. 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 He does report some mild fatigue and hot flashes and slight weight gain associated with Lupron.  He does not report any headaches, blurry vision, syncope or seizures. He does not report any fevers, chills or sweats. He does not report any cough, wheezing or hemoptysis. He is not reporting nausea or vomiting or abdominal pain. He does not report any frequency urgency or hesitancy. He does not report any skeletal complaints. Remaining review of systems unremarkable.  Medications: I have reviewed the patient's current medications.  Current Outpatient Prescriptions  Medication Sig Dispense Refill  . sildenafil (VIAGRA) 50 MG tablet Take 50 mg by mouth daily as needed for erectile  dysfunction.     No current facility-administered medications for this visit.      Allergies: No Known Allergies  Past Medical History, Surgical history, Social history, and Family History were reviewed and updated.   Physical Exam: Blood pressure 137/75, pulse 73, temperature 98.7 F (37.1 C), temperature source Oral, resp. rate 18, height 5\' 5"  (1.651 m), weight 236 lb 4.8 oz (107.2 kg), SpO2 95 %.  Exam: ECOG 0 General appearance: alert, cooperative and appeared without distress. Head: Normocephalic, without obvious abnormality, atraumatic Throat: No oral thrush or ulcers. Neck: no adenopathy.  Back: negative Resp: clear to auscultation bilaterally Cardio: regular rate and rhythm, S1, S2 normal, no murmur, click, rub or gallop GI: soft, non-tender; bowel sounds normal; no masses,  no organomegaly Extremities: extremities normal, atraumatic, no cyanosis or edema Pulses: 2+ and symmetric Lymph nodes: Cervical, supraclavicular, and axillary nodes normal. Neurologic: Grossly normal    PSA on 05/09/2017 was 2.04 compared to 1.19 in December 2017. His testosterone level was 16.4 on 05/09/2017.  EXAM: NUCLEAR MEDICINE WHOLE BODY BONE SCAN  TECHNIQUE: Whole body anterior and posterior images were obtained approximately 3 hours after intravenous injection of radiopharmaceutical.  RADIOPHARMACEUTICALS:  21.6 mCi Technetium-79m MDP IV  COMPARISON:  CT same day.  MRI 09/10/2013.  FINDINGS: Bilateral renal function excretion. Increased activity noted in L4 vertebral body consistent with previously identified lesion noted on prior CT and MRI. No no other definite lesions are noted in the sacrum or pelvis to account for abnormal CT findings. No other bony abnormality identified.  IMPRESSION: 1. Increased activity noted in the L4 vertebral body consistent  with previously identified lesion noted on prior CT of the same day and prior MRI of 09/10/2013.  2. No other  lesions identified. Specifically no significant pelvic or sacral abnormal activity noted to account for abnormal findings on today's CT.   Impression and Plan:  71 year old gentleman with the following issues:  1. Prostate cancer diagnosed in August of 2014 presented with a PSA 71 and a Gleason score of 4+4 equals 8. His staging workup revealed an L4 vertebral body metastasis. He was started on Lupron with excellent PSA response with a PSA of 0.65 in July 2016. He was offered systemic chemotherapy in the form of Taxotere at the time of diagnosis and he declined.  PSA in May 2018 was up to 2.04 from 1.19 in December 2017. Repeat bone scan continued to show increased activity in the L4 vertebral body.  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 He did not receive any additional therapy given his low volume disease at the time of diagnosis. Given the rise in his PSA it is reasonable to consider second line hormonal therapy at this time. These options would be in the form of [place]. Risks and benefits and complications associated with both medication were reviewed in detail. I also discussed the role of systemic chemotherapy which is reasonable to defer at this time.  I do favor using Xtandi in him given his low volume disease and the favorable side effect profile. The fact that he does not have to take prednisone is also an added incentive.  After discussion today, he would like to defer starting any of these medications until after his next PSA in September 2018. At that time, we will recheck his PSA and we'll consider starting Xtandi at that time.  2. Androgen deprivation: Prior to be continued indefinitely.  3. Bone directed therapy: He is currently on calcium and vitamin D supplements. Delton See can be deferred for the time being.  4. Follow-up: Will be in September 2018 to follow his progress.    Zola Button,  MD 6/22/20182:08 PM

## 2017-06-13 NOTE — Telephone Encounter (Signed)
Scheduled appt per 6/22 los - Gave patient AVS and calender per los.  

## 2017-09-12 ENCOUNTER — Other Ambulatory Visit (HOSPITAL_BASED_OUTPATIENT_CLINIC_OR_DEPARTMENT_OTHER): Payer: Medicare Other

## 2017-09-12 DIAGNOSIS — C7951 Secondary malignant neoplasm of bone: Secondary | ICD-10-CM | POA: Diagnosis not present

## 2017-09-12 DIAGNOSIS — C61 Malignant neoplasm of prostate: Secondary | ICD-10-CM | POA: Diagnosis not present

## 2017-09-12 LAB — CBC WITH DIFFERENTIAL/PLATELET
BASO%: 0.4 % (ref 0.0–2.0)
Basophils Absolute: 0 10*3/uL (ref 0.0–0.1)
EOS ABS: 0.1 10*3/uL (ref 0.0–0.5)
EOS%: 2.6 % (ref 0.0–7.0)
HCT: 39.7 % (ref 38.4–49.9)
HEMOGLOBIN: 12.8 g/dL — AB (ref 13.0–17.1)
LYMPH%: 48.5 % (ref 14.0–49.0)
MCH: 29.3 pg (ref 27.2–33.4)
MCHC: 32.2 g/dL (ref 32.0–36.0)
MCV: 90.8 fL (ref 79.3–98.0)
MONO#: 0.3 10*3/uL (ref 0.1–0.9)
MONO%: 6.5 % (ref 0.0–14.0)
NEUT%: 42 % (ref 39.0–75.0)
NEUTROS ABS: 1.9 10*3/uL (ref 1.5–6.5)
Platelets: 189 10*3/uL (ref 140–400)
RBC: 4.37 10*6/uL (ref 4.20–5.82)
RDW: 14.1 % (ref 11.0–14.6)
WBC: 4.6 10*3/uL (ref 4.0–10.3)
lymph#: 2.2 10*3/uL (ref 0.9–3.3)

## 2017-09-12 LAB — COMPREHENSIVE METABOLIC PANEL
ALT: 15 U/L (ref 0–55)
AST: 15 U/L (ref 5–34)
Albumin: 3.5 g/dL (ref 3.5–5.0)
Alkaline Phosphatase: 145 U/L (ref 40–150)
Anion Gap: 7 mEq/L (ref 3–11)
BILIRUBIN TOTAL: 0.68 mg/dL (ref 0.20–1.20)
BUN: 22.2 mg/dL (ref 7.0–26.0)
CO2: 26 meq/L (ref 22–29)
Calcium: 9.3 mg/dL (ref 8.4–10.4)
Chloride: 107 mEq/L (ref 98–109)
Creatinine: 1 mg/dL (ref 0.7–1.3)
EGFR: 86 mL/min/{1.73_m2} — AB (ref >90)
GLUCOSE: 93 mg/dL (ref 70–140)
Potassium: 4.9 mEq/L (ref 3.5–5.1)
SODIUM: 140 meq/L (ref 136–145)
Total Protein: 7.5 g/dL (ref 6.4–8.3)

## 2017-09-13 LAB — PSA: PROSTATE SPECIFIC AG, SERUM: 3.2 ng/mL (ref 0.0–4.0)

## 2017-09-19 ENCOUNTER — Ambulatory Visit (HOSPITAL_BASED_OUTPATIENT_CLINIC_OR_DEPARTMENT_OTHER): Payer: Medicare Other | Admitting: Oncology

## 2017-09-19 ENCOUNTER — Telehealth: Payer: Self-pay | Admitting: Oncology

## 2017-09-19 VITALS — BP 148/63 | HR 78 | Temp 98.5°F | Resp 18 | Ht 65.0 in | Wt 241.8 lb

## 2017-09-19 DIAGNOSIS — E291 Testicular hypofunction: Secondary | ICD-10-CM | POA: Diagnosis not present

## 2017-09-19 DIAGNOSIS — G8929 Other chronic pain: Secondary | ICD-10-CM | POA: Diagnosis not present

## 2017-09-19 DIAGNOSIS — R61 Generalized hyperhidrosis: Secondary | ICD-10-CM

## 2017-09-19 DIAGNOSIS — C7951 Secondary malignant neoplasm of bone: Secondary | ICD-10-CM | POA: Diagnosis not present

## 2017-09-19 DIAGNOSIS — M545 Low back pain: Secondary | ICD-10-CM

## 2017-09-19 DIAGNOSIS — C61 Malignant neoplasm of prostate: Secondary | ICD-10-CM | POA: Diagnosis not present

## 2017-09-19 MED ORDER — ENZALUTAMIDE 40 MG PO CAPS: 160.0000 mg | ORAL_CAPSULE | Freq: Every day | ORAL | 0 refills | Status: DC

## 2017-09-19 NOTE — Progress Notes (Signed)
Hematology and Oncology Follow Up Visit  Benjamin Brown [id number] 12/20/1946 71 y.o. 09/19/2017 11:56 AM [name], MDHill, Berneta Sages, MD   Principle Diagnosis: 71 year old gentleman diagnosed with prostate cancer Gleason score 4+4 equals 8 and a PSA 71.9 diagnosed in August of 2014. 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   Current therapy:  Lupron 30 mg injection every 4 months given under the care of Dr. Alinda Money since 2014. Under consideration to start cycle on hormone therapy.  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 He continues to have issues with hot flashes but has been manageable. Reports no recent complaints. He does report intermittent back pain which has been chronic in nature. 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 He reports no urination difficulties. He denied any hematuria or dysuria.  He does not report any headaches, blurry vision, syncope or seizures. He does not report any fevers, chills or sweats. He does not report any cough, wheezing or hemoptysis. He is not reporting nausea or vomiting or abdominal pain. He does not report any frequency urgency or hesitancy. He does not report any skeletal complaints. Remaining review of systems unremarkable.  Medications: I have reviewed the patient's current medications.  Current Outpatient Prescriptions  Medication Sig Dispense Refill  . enzalutamide (XTANDI) 40 MG capsule Take 4 capsules (160 mg total) by mouth daily. 120 capsule 0   No current facility-administered medications for this visit.      Allergies: No Known Allergies  Past Medical History, Surgical history, Social history, and Family History were reviewed and updated.   Physical Exam: Blood pressure (!) 148/63, pulse 78, temperature 98.5 F (36.9 C), temperature source  Oral, resp. rate 18, height 5\' 5"  (1.651 m), weight 241 lb 12.8 oz (109.7 kg), SpO2 96 %.  Exam: ECOG 0 General appearance: Well-appearing gentleman without distress. Head: No oral thrush or ulcers. Neck: no adenopathy.  Back: negative Resp: clear to auscultation bilaterally Cardio: regular rate and rhythm, S1, S2 normal, no murmur, click, rub or gallop GI: soft, non-tender; bowel sounds normal; no masses, no shifting dullness or ascites. Extremities: extremities normal, atraumatic, no cyanosis or edema Pulses: 2+ and symmetric Lymph nodes: Cervical, supraclavicular, and axillary nodes normal. Neurologic: Grossly normal    CBC    Component Value Date/Time   WBC 4.6 09/12/2017 1027   RBC 4.37 09/12/2017 1027   HGB 12.8 (L) 09/12/2017 1027   HCT 39.7 09/12/2017 1027   PLT 189 09/12/2017 1027   MCV 90.8 09/12/2017 1027   MCH 29.3 09/12/2017 1027   MCHC 32.2 09/12/2017 1027   RDW 14.1 09/12/2017 1027   LYMPHSABS 2.2 09/12/2017 1027   MONOABS 0.3 09/12/2017 1027   EOSABS 0.1 09/12/2017 1027   BASOSABS 0.0 09/12/2017 1027     Chemistry      Component Value Date/Time   NA 140 09/12/2017 1027   K 4.9 09/12/2017 1027   CO2 26 09/12/2017 1027   BUN 22.2 09/12/2017 1027   CREATININE 1.0 09/12/2017 1027      Component Value Date/Time   CALCIUM 9.3 09/12/2017 1027   ALKPHOS 145 09/12/2017 1027   AST 15 09/12/2017 1027   ALT 15 09/12/2017 1027   BILITOT 0.68 09/12/2017 1027     Results for CLENT, DAMORE (MRN [id number]) as of 09/19/2017 11:58  Ref. Range 09/12/2017  10:27  Prostate Specific Ag, Serum Latest Ref Range: 0.0 - 4.0 ng/mL 3.2    EXAM: NUCLEAR MEDICINE WHOLE BODY BONE SCAN  TECHNIQUE: Whole body anterior and posterior images were obtained approximately 3 hours after intravenous injection of radiopharmaceutical.  RADIOPHARMACEUTICALS:  21.6 mCi Technetium-4m MDP IV  COMPARISON:  CT same day.  MRI 09/10/2013.  FINDINGS: Bilateral renal function  excretion. Increased activity noted in L4 vertebral body consistent with previously identified lesion noted on prior CT and MRI. No no other definite lesions are noted in the sacrum or pelvis to account for abnormal CT findings. No other bony abnormality identified.  IMPRESSION: 1. Increased activity noted in the L4 vertebral body consistent with previously identified lesion noted on prior CT of the same day and prior MRI of 09/10/2013.  2. No other lesions identified. Specifically no significant pelvic or sacral abnormal activity noted to account for abnormal findings on today's CT.   Impression and Plan:  71 year old gentleman with the following issues:  1. Prostate cancer diagnosed in August of 2014 presented with a PSA 71 and a Gleason score of 4+4 equals 8. His staging workup revealed an L4 vertebral body metastasis. He was started on Lupron with excellent PSA response with a PSA of 0.65 in July 2016. He was offered systemic chemotherapy in the form of Taxotere at the time of diagnosis and he declined.  PSA in May 2018 was up to 2.04 from 1.19 in December 2017. Repeat bone scan continued to show increased activity in the L4 vertebral body. His PSA on 09/12/2017 was 3.2.  Given the rise in his PSA it is reasonable to consider second line hormonal therapy at this time. These options would be in the form of [place]. Risks and benefits and complications associated with both medication were reviewed again in detail. Alternative although therapy was also discussed today including bicalutamide and ketoconazole.  I do favor using Xtandi in him given his low volume disease and the favorable side effect profile. Complications associated with this medication specifically includes fatigue, hot flashes, hematuria and rarely seizures.  After discussion today, he is agreeable to proceed with Xtandi. He will receive 160 mg daily and I will recheck his PSA in 4-6 weeks.  2. Androgen  deprivation: to be continued indefinitely. I am happy to resume treating him with that in the future if Dr. Alinda Money feels appropriate.  3. Bone directed therapy: He is currently on calcium and vitamin D supplements. Delton See can be deferred for the time being.  4. Follow-up: Will be in November 2018.    Zola Button, MD 9/28/201811:56 AM

## 2017-09-19 NOTE — Telephone Encounter (Signed)
Gave patient AVS and calendar of upcoming November appointments.  °

## 2017-09-19 NOTE — Progress Notes (Signed)
Educational packet and bochure re: xtandi, given and explained to patient. Script delivered to oral chemo navigator's office

## 2017-09-22 ENCOUNTER — Telehealth: Payer: Self-pay | Admitting: Pharmacy Technician

## 2017-09-22 NOTE — Telephone Encounter (Signed)
Oral Oncology Patient Advocate Encounter  Prior Authorization for Benjamin Brown has been approved.    PA# [id number] Effective dates: 09/22/2017 through 12/22/2098  Oral Oncology Clinic will continue to follow.   [name]. [name], [name] Patient [place] [phone] 09/22/2017 12:54 PM

## 2017-09-22 NOTE — Telephone Encounter (Signed)
Oral Oncology Patient Advocate Encounter  Received notification from [place] that prior authorization for Gillermina Phy is required.  PA submitted via phone Case # [id number] Status is pending  Oral Oncology Clinic will continue to follow.  [name]. [name], [place] Patient Benjamin Brown [phone] 09/22/2017 10:36 AM

## 2017-09-26 ENCOUNTER — Telehealth: Payer: Self-pay | Admitting: Pharmacist

## 2017-09-26 DIAGNOSIS — C61 Malignant neoplasm of prostate: Secondary | ICD-10-CM

## 2017-09-26 MED ORDER — ENZALUTAMIDE 40 MG PO CAPS: 160.0000 mg | ORAL_CAPSULE | Freq: Every day | ORAL | 0 refills | Status: DC

## 2017-09-26 NOTE — Telephone Encounter (Signed)
Oral Oncology Pharmacist Encounter  Received new prescription for [facility] for the treatment of metastatic, castration-resistant prostate cancer in combination with Lupron injection, planned duration until disease progression or unacceptable toxicity.  Labs from 09/12/17 assessed, OK for treatment.  Current medication list in Epic reviewed, no DDIs with Xtandi identified as it is the only medication on patient's list. This was confirmed with patient.  Prescription has been e-scribed to the [facility] for benefits analysis and approval. Patient plans to pick-up his Xtandi on Friday 10/03/17. He will call the office once he starts medication to alert [place] of start date.   I spoke with patient for overview of new oral chemotherapy medication: Xtandi.   Counseled patient on administration, dosing, side effects, monitoring, drug-food interactions, safe handling, storage, and disposal.  Patient will take Xtandi 40mg  tablets, 4 tablets (160mg ) by mouth once daily without regard to food. Xtandi start date: pending  Side effects include but not limited to: peripheral edema, GI upset, hypertension, hot flashes, fatigue, and arthralgias.    Reviewed with patient importance of keeping a medication schedule and plan for any missed doses.  Mr. Holness voiced understanding and appreciation.   All questions answered. Medication reconciliation performed and medication/allergy list updated.  Patient knows to call the office with questions or concerns.   Oral Oncology Clinic will continue to follow for insurance authorization, copayment issues, and start date.  Johny Drilling, PharmD, BCPS, BCOP 09/26/2017 9:45 AM Oral Oncology Clinic [phone]

## 2017-10-08 MED FILL — XTANDI 40 MG CAPSULE: 40 | 30 days supply | Qty: 120 | Fill #0

## 2017-10-08 NOTE — Telephone Encounter (Signed)
Oral Oncology Patient Advocate Encounter  Received communication from the pharmacy that the patient had been unable to come to the pharmacy to pick up his medication.  [place] staff has made arrangements with the patient to ship his medication to his home.  The medication will be shipped today for arrival to his home on 10/09/2017.   Benjamin Brown. [name], [place] Patient [place] [phone] 10/08/2017 2:26 PM

## 2017-10-24 ENCOUNTER — Other Ambulatory Visit (HOSPITAL_BASED_OUTPATIENT_CLINIC_OR_DEPARTMENT_OTHER): Payer: Medicare Other

## 2017-10-24 DIAGNOSIS — C61 Malignant neoplasm of prostate: Secondary | ICD-10-CM

## 2017-10-24 DIAGNOSIS — C7951 Secondary malignant neoplasm of bone: Secondary | ICD-10-CM | POA: Diagnosis not present

## 2017-10-24 LAB — COMPREHENSIVE METABOLIC PANEL
ALT: 12 U/L (ref 0–55)
AST: 14 U/L (ref 5–34)
Albumin: 3.6 g/dL (ref 3.5–5.0)
Alkaline Phosphatase: 129 U/L (ref 40–150)
Anion Gap: 7 mEq/L (ref 3–11)
BILIRUBIN TOTAL: 0.88 mg/dL (ref 0.20–1.20)
BUN: 16.8 mg/dL (ref 7.0–26.0)
CHLORIDE: 104 meq/L (ref 98–109)
CO2: 27 meq/L (ref 22–29)
CREATININE: 0.9 mg/dL (ref 0.7–1.3)
Calcium: 9.4 mg/dL (ref 8.4–10.4)
EGFR: >60 mL/min/{1.73_m2} (ref >60)
GLUCOSE: 98 mg/dL (ref 70–140)
Potassium: 4.7 mEq/L (ref 3.5–5.1)
SODIUM: 138 meq/L (ref 136–145)
TOTAL PROTEIN: 7.8 g/dL (ref 6.4–8.3)

## 2017-10-24 LAB — CBC WITH DIFFERENTIAL/PLATELET
BASO%: 0.6 % (ref 0.0–2.0)
Basophils Absolute: 0 10*3/uL (ref 0.0–0.1)
EOS%: 2.6 % (ref 0.0–7.0)
Eosinophils Absolute: 0.1 10*3/uL (ref 0.0–0.5)
HCT: 39.3 % (ref 38.4–49.9)
HGB: 12.7 g/dL — ABNORMAL LOW (ref 13.0–17.1)
LYMPH%: 49 % (ref 14.0–49.0)
MCH: 28.9 pg (ref 27.2–33.4)
MCHC: 32.3 g/dL (ref 32.0–36.0)
MCV: 89.5 fL (ref 79.3–98.0)
MONO#: 0.4 10*3/uL (ref 0.1–0.9)
MONO%: 8.7 % (ref 0.0–14.0)
NEUT%: 39.1 % (ref 39.0–75.0)
NEUTROS ABS: 1.6 10*3/uL (ref 1.5–6.5)
PLATELETS: 213 10*3/uL (ref 140–400)
RBC: 4.39 10*6/uL (ref 4.20–5.82)
RDW: 14.9 % — ABNORMAL HIGH (ref 11.0–14.6)
WBC: 4.2 10*3/uL (ref 4.0–10.3)
lymph#: 2.1 10*3/uL (ref 0.9–3.3)

## 2017-10-25 LAB — PSA: PROSTATE SPECIFIC AG, SERUM: 0.9 ng/mL (ref 0.0–4.0)

## 2017-10-30 ENCOUNTER — Other Ambulatory Visit: Payer: Self-pay | Admitting: Oncology

## 2017-10-30 DIAGNOSIS — C61 Malignant neoplasm of prostate: Secondary | ICD-10-CM

## 2017-10-31 ENCOUNTER — Telehealth: Payer: Self-pay | Admitting: Oncology

## 2017-10-31 ENCOUNTER — Ambulatory Visit (HOSPITAL_BASED_OUTPATIENT_CLINIC_OR_DEPARTMENT_OTHER): Payer: Medicare Other | Admitting: Oncology

## 2017-10-31 VITALS — BP 160/88 | HR 67 | Temp 97.8°F | Resp 20 | Ht 65.0 in | Wt 240.0 lb

## 2017-10-31 DIAGNOSIS — C7951 Secondary malignant neoplasm of bone: Secondary | ICD-10-CM | POA: Diagnosis not present

## 2017-10-31 DIAGNOSIS — E291 Testicular hypofunction: Secondary | ICD-10-CM

## 2017-10-31 DIAGNOSIS — M545 Low back pain: Secondary | ICD-10-CM | POA: Diagnosis not present

## 2017-10-31 DIAGNOSIS — G8929 Other chronic pain: Secondary | ICD-10-CM

## 2017-10-31 DIAGNOSIS — C61 Malignant neoplasm of prostate: Secondary | ICD-10-CM

## 2017-10-31 NOTE — Progress Notes (Signed)
Hematology and Oncology Follow Up Visit  Benjamin Brown [id number] 06/16/1946 71 y.o. 10/31/2017 2:18 PM Benjamin Brown, MDHill, Benjamin Sages, MD   Principle Diagnosis: 71 year old gentleman diagnosed with prostate cancer Gleason score 4+4 equals 8 and a PSA 71.9 diagnosed in August of 2014. 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   Current therapy:  Lupron 30 mg injection every 4 months given under the care of Dr. Alinda Money since 2014.  Xtandi 160 mg daily started on October 11, 2017.  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  He denies any nausea, excessive fatigue or edema.  He does report some mild lightheadedness that does not interfere with his function.  He did not report any worsening hot flashes. He does report intermittent back pain which has been chronic in nature. 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  He denies any urinary complaints or changes in his bowel habits.  He does not report any headaches, blurry vision, syncope or seizures. He does not report any fevers, chills or sweats. He does not report any cough, wheezing or hemoptysis. He is not reporting nausea or vomiting or abdominal pain. He does not report any frequency urgency or hesitancy. He does not report any skeletal complaints. Remaining review of systems unremarkable.  Medications: I have reviewed the patient's current medications.  Current Outpatient Medications  Medication Sig Dispense Refill  . XTANDI 40 MG capsule TAKE 4 CAPSULES (160 MG TOTAL) BY MOUTH DAILY. 120 capsule 0   No current facility-administered medications for this visit.      Allergies: No Known Allergies  Past Medical History, Surgical history, Social history, and Family History were reviewed and  updated.   Physical Exam: Blood pressure (!) 160/88, pulse 67, temperature 97.8 F (36.6 C), temperature source Oral, resp. rate 20, height 5\' 5"  (1.651 m), weight 240 lb (108.9 kg), SpO2 96 %.  Exam: ECOG 0 General appearance: Alert, awake gentleman appeared without distress. Head: No oral masses or lesions noted. Neck: no adenopathy or neck masses. Resp: clear to auscultation bilaterally without rhonchi or wheezes. Cardio: regular rate and rhythm, S1, S2 normal, no murmur, click, rub or gallop GI: soft, non-tender; bowel sounds normal; no masses, no shifting dullness or ascites. Extremities: extremities normal, atraumatic, no cyanosis or edema Lymph nodes: Cervical, supraclavicular, and axillary nodes normal. Neurologic: Grossly normal  without any deficits noted.   CBC    Component Value Date/Time   WBC 4.2 10/24/2017 0839   RBC 4.39 10/24/2017 0839   HGB 12.7 (L) 10/24/2017 0839   HCT 39.3 10/24/2017 0839   PLT 213 10/24/2017 0839   MCV 89.5 10/24/2017 0839   MCH 28.9 10/24/2017 0839   MCHC 32.3 10/24/2017 0839   RDW 14.9 (H) 10/24/2017 0839   LYMPHSABS 2.1 10/24/2017 0839   MONOABS 0.4 10/24/2017 0839   EOSABS 0.1 10/24/2017 0839   BASOSABS 0.0 10/24/2017 0839     Chemistry      Component Value Date/Time   NA 138 10/24/2017 0839   K 4.7 10/24/2017 0839   CO2 27 10/24/2017 0839   BUN 16.8 10/24/2017 0839   CREATININE 0.9 10/24/2017 0839      Component Value Date/Time   CALCIUM 9.4 10/24/2017 0839   ALKPHOS 129 10/24/2017 0839   AST 14 10/24/2017 0839  ALT 12 10/24/2017 0839   BILITOT 0.88 10/24/2017 0839     Results for [name], [name] (MRN [id number]) as of 10/31/2017 14:05  Ref. Range 09/12/2017 10:27 10/24/2017 08:39  Prostate Specific Ag, Serum Latest Ref Range: 0.0 - 4.0 ng/mL 3.2 0.9     Impression and Plan:  71 year old gentleman with the following issues:  1. Prostate cancer diagnosed in August of 2014 presented with a PSA 71 and a Gleason  score of 4+4 equals 8. His staging workup revealed an L4 vertebral body metastasis. He was started on Lupron with excellent PSA response with a PSA of 0.65 in July 2016. He was offered systemic chemotherapy in the form of Taxotere at the time of diagnosis and he declined.  PSA in May 2018 was up to 2.04 from 1.19 in december 2017. Repeat bone scan continued to show increased activity in the L4 vertebral body. His PSA on 09/12/2017 was 3.2.  He is currently on Xtandi 160 mg daily started on October 11, 2017.  He tolerated this medication well without any major complications.  His PSA on October 24, 2017 was 0.9.  Risks and benefits of continuing this medication was reviewed today and he is agreeable to continue with the current dose and schedule.  We can consider dose reductions in the future if he develops any worsening side effects.   2. Androgen deprivation: to be continued indefinitely.  This can be resumed at the [facility] based on the patient's preference.  3. Bone directed therapy: He is currently on calcium and vitamin D supplements. Delton See can be deferred for the time being.  4. Follow-up: In 6-8 weeks to follow his progress and repeat PSA.    Benjamin Button, MD 11/9/20182:18 PM

## 2017-10-31 NOTE — Telephone Encounter (Signed)
Scheduled appt per 11/9 los - Gave patient AVS and calender per los.  

## 2017-11-03 MED FILL — XTANDI 40 MG CAPSULE: 40 | 30 days supply | Qty: 120 | Fill #0

## 2017-11-27 ENCOUNTER — Other Ambulatory Visit: Payer: Self-pay | Admitting: Oncology

## 2017-11-27 DIAGNOSIS — C61 Malignant neoplasm of prostate: Secondary | ICD-10-CM

## 2017-12-09 MED FILL — XTANDI 40 MG CAPSULE: 40 | 30 days supply | Qty: 120 | Fill #0

## 2017-12-19 ENCOUNTER — Other Ambulatory Visit (HOSPITAL_BASED_OUTPATIENT_CLINIC_OR_DEPARTMENT_OTHER): Payer: Medicare Other

## 2017-12-19 DIAGNOSIS — C7951 Secondary malignant neoplasm of bone: Secondary | ICD-10-CM

## 2017-12-19 DIAGNOSIS — C61 Malignant neoplasm of prostate: Secondary | ICD-10-CM | POA: Diagnosis not present

## 2017-12-19 LAB — COMPREHENSIVE METABOLIC PANEL
ALT: 10 U/L (ref 0–55)
AST: 13 U/L (ref 5–34)
Albumin: 3.5 g/dL (ref 3.5–5.0)
Alkaline Phosphatase: 132 U/L (ref 40–150)
Anion Gap: 8 mEq/L (ref 3–11)
BILIRUBIN TOTAL: 0.54 mg/dL (ref 0.20–1.20)
BUN: 15.9 mg/dL (ref 7.0–26.0)
CALCIUM: 9.6 mg/dL (ref 8.4–10.4)
CHLORIDE: 107 meq/L (ref 98–109)
CO2: 27 meq/L (ref 22–29)
CREATININE: 1 mg/dL (ref 0.7–1.3)
EGFR: >60 mL/min/{1.73_m2} (ref >60)
Glucose: 97 mg/dl (ref 70–140)
Potassium: 4.6 mEq/L (ref 3.5–5.1)
Sodium: 142 mEq/L (ref 136–145)
TOTAL PROTEIN: 7.5 g/dL (ref 6.4–8.3)

## 2017-12-19 LAB — CBC WITH DIFFERENTIAL/PLATELET
BASO%: 0.6 % (ref 0.0–2.0)
Basophils Absolute: 0 10*3/uL (ref 0.0–0.1)
EOS ABS: 0.1 10*3/uL (ref 0.0–0.5)
EOS%: 2.2 % (ref 0.0–7.0)
HCT: 39.9 % (ref 38.4–49.9)
HGB: 12.9 g/dL — ABNORMAL LOW (ref 13.0–17.1)
LYMPH%: 47.4 % (ref 14.0–49.0)
MCH: 29 pg (ref 27.2–33.4)
MCHC: 32.4 g/dL (ref 32.0–36.0)
MCV: 89.6 fL (ref 79.3–98.0)
MONO#: 0.3 10*3/uL (ref 0.1–0.9)
MONO%: 6.7 % (ref 0.0–14.0)
NEUT#: 1.7 10*3/uL (ref 1.5–6.5)
NEUT%: 43.1 % (ref 39.0–75.0)
Platelets: 226 10*3/uL (ref 140–400)
RBC: 4.46 10*6/uL (ref 4.20–5.82)
RDW: 14.6 % (ref 11.0–14.6)
WBC: 4 10*3/uL (ref 4.0–10.3)
lymph#: 1.9 10*3/uL (ref 0.9–3.3)

## 2017-12-20 LAB — PSA

## 2017-12-26 ENCOUNTER — Telehealth: Payer: Self-pay | Admitting: Oncology

## 2017-12-26 ENCOUNTER — Ambulatory Visit (HOSPITAL_BASED_OUTPATIENT_CLINIC_OR_DEPARTMENT_OTHER): Payer: Medicare Other | Admitting: Oncology

## 2017-12-26 VITALS — BP 144/85 | HR 68 | Temp 97.8°F | Resp 18 | Ht 65.0 in | Wt 238.7 lb

## 2017-12-26 DIAGNOSIS — R61 Generalized hyperhidrosis: Secondary | ICD-10-CM | POA: Diagnosis not present

## 2017-12-26 DIAGNOSIS — E291 Testicular hypofunction: Secondary | ICD-10-CM | POA: Diagnosis not present

## 2017-12-26 DIAGNOSIS — C7951 Secondary malignant neoplasm of bone: Secondary | ICD-10-CM

## 2017-12-26 DIAGNOSIS — M545 Low back pain: Secondary | ICD-10-CM | POA: Diagnosis not present

## 2017-12-26 DIAGNOSIS — R5383 Other fatigue: Secondary | ICD-10-CM | POA: Diagnosis not present

## 2017-12-26 DIAGNOSIS — G8929 Other chronic pain: Secondary | ICD-10-CM | POA: Diagnosis not present

## 2017-12-26 DIAGNOSIS — C61 Malignant neoplasm of prostate: Secondary | ICD-10-CM

## 2017-12-26 NOTE — Progress Notes (Signed)
Hematology and Oncology Follow Up Visit  Benjamin Brown [id number] 08-Dec-1946 71 y.o. 12/26/2017 3:09 PM Benjamin Brown, MDHill, Benjamin Sages, MD   Principle Diagnosis: 72 year old gentleman diagnosed with prostate cancer Gleason score 4+4 equals 8 and a PSA 71.9 diagnosed in August of 2014. 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   Current therapy:  Lupron 30 mg injection every 4 months given under the care of Dr. Alinda Brown since 2014.  Xtandi 160 mg daily started on October 11, 2017.  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 Since the last visit, he reports no recent changes in his health. He does report slight increase in his fatigue and hot flashes. He continues Xtandi without any major complications.  He denies any nausea, excessive fatigue or edema. He does report intermittent back pain which has been chronic in nature and has not changed since last visit. 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  He denies any urinary complaints or changes in his bowel habits. He does report some slight discoloration in his urine periodically.  He does not report any headaches, blurry vision, syncope or seizures. He does not report any fevers, chills or sweats. He does not report any cough, wheezing or hemoptysis. He is not reporting nausea or vomiting or abdominal pain. He does not report any frequency urgency or hesitancy. He does not report any skeletal complaints. Remaining review of systems unremarkable.  Medications: I have reviewed the patient's current medications.  Current Outpatient Medications  Medication Sig Dispense Refill  . XTANDI 40 MG capsule TAKE 4 CAPSULES (160 MG TOTAL) BY MOUTH DAILY. 120 capsule 0   No current facility-administered medications for this visit.      Allergies: No Known Allergies  Past Medical History, Surgical history, Social history,  and Family History were reviewed and updated.   Physical Exam: Blood pressure (!) 144/85, pulse 68, temperature 97.8 F (36.6 C), temperature source Oral, resp. rate 18, height 5\' 5"  (1.651 m), weight 238 lb 11.2 oz (108.3 kg), SpO2 100 %.  Exam: ECOG 0 General appearance: Well-appearing gentleman appeared without distress. Head: No oral thrush or ulcers. Neck: no adenopathy  Resp: clear to auscultation bilaterally without rhonchi or wheezes. Cardio: regular rate and rhythm, S1, S2 normal, no murmur, click, rub or gallop GI: soft, non-tender; bowel sounds normal; no masses, no shifting dullness or ascites. Extremities: extremities normal, atraumatic, no cyanosis or edema Lymph nodes: Cervical, supraclavicular, and axillary nodes normal. Neurologic: No deficits noted. He ambulates without difficulties.   CBC    Component Value Date/Time   WBC 4.0 12/19/2017 1003   RBC 4.46 12/19/2017 1003   HGB 12.9 (L) 12/19/2017 1003   HCT 39.9 12/19/2017 1003   PLT 226 12/19/2017 1003   MCV 89.6 12/19/2017 1003   MCH 29.0 12/19/2017 1003   MCHC 32.4 12/19/2017 1003   RDW 14.6 12/19/2017 1003   LYMPHSABS 1.9 12/19/2017 1003   MONOABS 0.3 12/19/2017 1003   EOSABS 0.1 12/19/2017 1003   BASOSABS 0.0 12/19/2017 1003     Chemistry      Component Value Date/Time   NA 142 12/19/2017 1003   K 4.6 12/19/2017 1003   CO2 27 12/19/2017 1003   BUN 15.9 12/19/2017 1003   CREATININE 1.0 12/19/2017 1003      Component Value Date/Time   CALCIUM 9.6 12/19/2017 1003   ALKPHOS 132 12/19/2017 1003  AST 13 12/19/2017 1003   ALT 10 12/19/2017 1003   BILITOT 0.54 12/19/2017 1003      Results for Benjamin Brown, Benjamin Brown (MRN [id number]) as of 12/26/2017 14:18  Ref. Range 10/24/2017 08:39 12/19/2017 10:03  Prostate Specific Ag, Serum Latest Ref Range: 0.0 - 4.0 ng/mL 0.9 <0.1     Impression and Plan:  72 year old gentleman with the following issues:  1. Prostate cancer diagnosed in August of 2014  presented with a PSA 71 and a Gleason score of 4+4 equals 8. His staging workup revealed an L4 vertebral body metastasis. He was started on Lupron with excellent PSA response with a PSA of 0.65 in July 2016. He was offered systemic chemotherapy in the form of Taxotere at the time of diagnosis and he declined.  PSA in May 2018 was up to 2.04 from 1.19 in december 2017. Repeat bone scan continued to show increased activity in the L4 vertebral body. His PSA on 09/12/2017 was 3.2.  He is currently on Xtandi 160 mg daily started on October 11, 2017 and continues to take it without complications.  His PSA on December 19, 2017 was less than 0.1 indicating undetectable levels. Risks and benefits of continuing this medication was reviewed today and is agreeable to continue. He does have some mild fatigue but continues to be tolerable.   2. Androgen deprivation: to be continued indefinitely. He'll receive Lupron at 30 mg every 4 months starting on 02/20/2018.  3. Bone directed therapy: He is currently on calcium and vitamin D supplements. Delton See can be deferred for the time being.  4. Follow-up: In 8 weeks to follow his progress and repeat PSA.    Zola Button, MD 1/4/20193:09 PM

## 2017-12-26 NOTE — Telephone Encounter (Signed)
Scheduled appt per 1/4 los - Gave patient AVS and calender per los.  

## 2017-12-31 ENCOUNTER — Other Ambulatory Visit: Payer: Self-pay | Admitting: Oncology

## 2017-12-31 DIAGNOSIS — C61 Malignant neoplasm of prostate: Secondary | ICD-10-CM

## 2018-01-06 MED FILL — XTANDI 40 MG CAPSULE: 40 | 30 days supply | Qty: 120 | Fill #0

## 2018-01-30 ENCOUNTER — Other Ambulatory Visit: Payer: Self-pay | Admitting: Oncology

## 2018-01-30 DIAGNOSIS — C61 Malignant neoplasm of prostate: Secondary | ICD-10-CM

## 2018-02-05 MED FILL — XTANDI 40 MG CAPSULE: 40 | 30 days supply | Qty: 120 | Fill #0

## 2018-02-13 ENCOUNTER — Inpatient Hospital Stay: Payer: Medicare Other | Attending: Oncology

## 2018-02-13 DIAGNOSIS — C61 Malignant neoplasm of prostate: Secondary | ICD-10-CM

## 2018-02-13 LAB — COMPREHENSIVE METABOLIC PANEL
ALBUMIN: 3.6 g/dL (ref 3.5–5.0)
ALT: 10 U/L (ref 0–55)
AST: 14 U/L (ref 5–34)
Alkaline Phosphatase: 134 U/L (ref 40–150)
Anion gap: 10 (ref 3–11)
BUN: 16 mg/dL (ref 7–26)
CHLORIDE: 105 mmol/L (ref 98–109)
CO2: 26 mmol/L (ref 22–29)
CREATININE: 0.91 mg/dL (ref 0.70–1.30)
Calcium: 10 mg/dL (ref 8.4–10.4)
GFR calc Af Amer: >60 mL/min (ref >60)
GFR calc non Af Amer: >60 mL/min (ref >60)
GLUCOSE: 99 mg/dL (ref 70–140)
POTASSIUM: 4.6 mmol/L (ref 3.5–5.1)
SODIUM: 141 mmol/L (ref 136–145)
Total Bilirubin: 0.9 mg/dL (ref 0.2–1.2)
Total Protein: 7.6 g/dL (ref 6.4–8.3)

## 2018-02-13 LAB — CBC WITH DIFFERENTIAL (CANCER CENTER ONLY)
BASOS ABS: 0 10*3/uL (ref 0.0–0.1)
BASOS PCT: 1 %
EOS PCT: 2 %
Eosinophils Absolute: 0.1 10*3/uL (ref 0.0–0.5)
HCT: 38.9 % (ref 38.4–49.9)
Hemoglobin: 12.7 g/dL — ABNORMAL LOW (ref 13.0–17.1)
LYMPHS PCT: 50 %
Lymphs Abs: 2 10*3/uL (ref 0.9–3.3)
MCH: 29.5 pg (ref 27.2–33.4)
MCHC: 32.6 g/dL (ref 32.0–36.0)
MCV: 90.6 fL (ref 79.3–98.0)
MONO ABS: 0.3 10*3/uL (ref 0.1–0.9)
Monocytes Relative: 8 %
Neutro Abs: 1.6 10*3/uL (ref 1.5–6.5)
Neutrophils Relative %: 39 %
PLATELETS: 238 10*3/uL (ref 140–400)
RBC: 4.3 MIL/uL (ref 4.20–5.82)
RDW: 14.5 % (ref 11.0–14.6)
WBC: 4 10*3/uL (ref 4.0–10.3)

## 2018-02-14 LAB — PROSTATE-SPECIFIC AG, SERUM (LABCORP)

## 2018-02-20 ENCOUNTER — Inpatient Hospital Stay: Payer: Medicare Other | Attending: Oncology | Admitting: Oncology

## 2018-02-20 ENCOUNTER — Inpatient Hospital Stay: Payer: Medicare Other

## 2018-02-20 VITALS — BP 143/82 | HR 80 | Temp 97.5°F | Resp 18 | Wt 232.6 lb

## 2018-02-20 DIAGNOSIS — Z5111 Encounter for antineoplastic chemotherapy: Secondary | ICD-10-CM | POA: Insufficient documentation

## 2018-02-20 DIAGNOSIS — C61 Malignant neoplasm of prostate: Secondary | ICD-10-CM

## 2018-02-20 DIAGNOSIS — C7951 Secondary malignant neoplasm of bone: Secondary | ICD-10-CM

## 2018-02-20 DIAGNOSIS — E291 Testicular hypofunction: Secondary | ICD-10-CM

## 2018-02-20 MED ORDER — LEUPROLIDE ACETATE (4 MONTH) 30 MG IM KIT
30.0000 mg | PACK | Freq: Once | INTRAMUSCULAR | Status: AC
  Administered 2018-02-20: 30 mg via INTRAMUSCULAR
  Filled 2018-02-20: qty 30

## 2018-02-20 NOTE — Patient Instructions (Signed)
Leuprolide depot injection What is this medicine? LEUPROLIDE (loo PROE lide) is a man-made protein that acts like a natural hormone in the body. It decreases testosterone in men and decreases estrogen in women. In men, this medicine is used to treat advanced prostate cancer. In women, some forms of this medicine may be used to treat endometriosis, uterine fibroids, or other male hormone-related problems. This medicine may be used for other purposes; ask your health care provider or pharmacist if you have questions. COMMON BRAND NAME(S): Eligard, Lupron Depot, Lupron Depot-Ped, Viadur What should I tell my health care provider before I take this medicine? They need to know if you have any of these conditions: -diabetes -heart disease or previous heart attack -high blood pressure -high cholesterol -mental illness -osteoporosis -pain or difficulty passing urine -seizures -spinal cord metastasis -stroke -suicidal thoughts, plans, or attempt; a previous suicide attempt by you or a family member -tobacco smoker -unusual vaginal bleeding (women) -an unusual or allergic reaction to leuprolide, benzyl alcohol, other medicines, foods, dyes, or preservatives -pregnant or trying to get pregnant -breast-feeding How should I use this medicine? This medicine is for injection into a muscle or for injection under the skin. It is given by a health care professional in a hospital or clinic setting. The specific product will determine how it will be given to you. Make sure you understand which product you receive and how often you will receive it. Talk to your pediatrician regarding the use of this medicine in children. Special care may be needed. Overdosage: If you think you have taken too much of this medicine contact a poison control center or emergency room at once. NOTE: This medicine is only for you. Do not share this medicine with others. What if I miss a dose? It is important not to miss a dose.  Call your doctor or health care professional if you are unable to keep an appointment. Depot injections: Depot injections are given either once-monthly, every 12 weeks, every 16 weeks, or every 24 weeks depending on the product you are prescribed. The product you are prescribed will be based on if you are male or male, and your condition. Make sure you understand your product and dosing. What may interact with this medicine? Do not take this medicine with any of the following medications: -chasteberry This medicine may also interact with the following medications: -herbal or dietary supplements, like black cohosh or DHEA -male hormones, like estrogens or progestins and birth control pills, patches, rings, or injections -male hormones, like testosterone This list may not describe all possible interactions. Give your health care provider a list of all the medicines, herbs, non-prescription drugs, or dietary supplements you use. Also tell them if you smoke, drink alcohol, or use illegal drugs. Some items may interact with your medicine. What should I watch for while using this medicine? Visit your doctor or health care professional for regular checks on your progress. During the first weeks of treatment, your symptoms may get worse, but then will improve as you continue your treatment. You may get hot flashes, increased bone pain, increased difficulty passing urine, or an aggravation of nerve symptoms. Discuss these effects with your doctor or health care professional, some of them may improve with continued use of this medicine. Male patients may experience a menstrual cycle or spotting during the first months of therapy with this medicine. If this continues, contact your doctor or health care professional. What side effects may I notice from receiving this medicine? Side   effects that you should report to your doctor or health care professional as soon as possible: -allergic reactions like skin  rash, itching or hives, swelling of the face, lips, or tongue -breathing problems -chest pain -depression or memory disorders -pain in your legs or groin -pain at site where injected or implanted -seizures -severe headache -swelling of the feet and legs -suicidal thoughts or other mood changes -visual changes -vomiting Side effects that usually do not require medical attention (report to your doctor or health care professional if they continue or are bothersome): -breast swelling or tenderness -decrease in sex drive or performance -diarrhea -hot flashes -loss of appetite -muscle, joint, or bone pains -nausea -redness or irritation at site where injected or implanted -skin problems or acne This list may not describe all possible side effects. Call your doctor for medical advice about side effects. You may report side effects to FDA at 1-800-FDA-1088. Where should I keep my medicine? This drug is given in a hospital or clinic and will not be stored at home. NOTE: This sheet is a summary. It may not cover all possible information. If you have questions about this medicine, talk to your doctor, pharmacist, or health care provider.  2018 Elsevier/Gold Standard (2016-05-23 09:45:53)  

## 2018-02-20 NOTE — Progress Notes (Signed)
Hematology and Oncology Follow Up Visit  Benjamin Brown [id number] 1946-08-24 72 y.o. 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 He was diagnosed with Gleason score 4+4 equals 8 and a PSA 71.9 August of 2014. .   Current therapy:  Lupron 30 mg injection every 4 months. Next injection to be given on 02/20/2018.  Xtandi 160 mg daily started on October 11, 2017.  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 Since the last visit, he reports no recent changes in his health. He remains active and attends to activities of daily living. He continues to work as a Engineer, drilling and has not reported changes in ability to do so. He continues to take extended he and has reported symptoms of fatigue and hot flashes. He denied any urinary symptoms including frequency or nocturia. He denies any hematuria or dysuria. He denied any bone pain or pathological fractures.  He does not report any headaches, blurry vision, syncope or seizures. He does not report any fevers, chills or sweats. He does not report any cough, wheezing or hemoptysis. He does not report any chest pain, palpitation or orthopnea. He is not reporting nausea or vomiting or abdominal pain.  He does not report any skeletal complaints. He denies any skin rashes or lesions. He does not report any lymphadenopathy or petechiae. Remaining review of systems is negative.  Medications: I have reviewed the patient's current medications.  Current Outpatient Medications  Medication Sig Dispense Refill  . XTANDI 40 MG capsule TAKE 4 CAPSULES (160 MG TOTAL) BY MOUTH DAILY. 120 capsule 0   No current facility-administered medications for this visit.      Allergies: No Known Allergies  Past Medical History, Surgical history, Social history, and Family History were reviewed and updated.   Physical Exam: Blood pressure (!)  143/82, pulse 80, temperature (!) 97.5 F (36.4 C), temperature source Oral, resp. rate 18, weight 232 lb 9.6 oz (105.5 kg), SpO2 99 %.  Exam: ECOG 0 General appearance: Alert, awake gentleman without distress. Head: Atraumatic. No abnormalities noted.  Oropharynx: No oral thrush or ulcers. Eyes: No scleral icterus. Resp: clear to auscultation in all lung fields without any rhonchi, wheezes or dullness to percussion. Cardio: S1 and S2 no murmurs or gallops. Regular rate and rhythm. GI: Soft, nontender without any rebound or guarding. Good bowel sounds. Musculoskeletal: No joint deformity or effusion. Full range of motion noted. Lymph nodes: Cervical, supraclavicular, and axillary nodes normal. Neurologic: No motor, sensory deficits noted. Skin: No rashes or lesions.   CBC    Component Value Date/Time   WBC 4.0 02/13/2018 1109   WBC 4.0 12/19/2017 1003   RBC 4.30 02/13/2018 1109   HGB 12.9 (L) 12/19/2017 1003   HCT 38.9 02/13/2018 1109   HCT 39.9 12/19/2017 1003   PLT 238 02/13/2018 1109   PLT 226 12/19/2017 1003   MCV 90.6 02/13/2018 1109   MCV 89.6 12/19/2017 1003   MCH 29.5 02/13/2018 1109   MCHC 32.6 02/13/2018 1109   RDW 14.5 02/13/2018 1109   RDW 14.6 12/19/2017 1003   LYMPHSABS 2.0 02/13/2018 1109   LYMPHSABS 1.9 12/19/2017 1003   MONOABS 0.3 02/13/2018 1109   MONOABS 0.3 12/19/2017 1003   EOSABS 0.1 02/13/2018 1109   EOSABS 0.1 12/19/2017 1003   BASOSABS 0.0 02/13/2018 1109   BASOSABS 0.0 12/19/2017 1003     Chemistry  Component Value Date/Time   NA 141 02/13/2018 1109   NA 142 12/19/2017 1003   K 4.6 02/13/2018 1109   K 4.6 12/19/2017 1003   CL 105 02/13/2018 1109   CO2 26 02/13/2018 1109   CO2 27 12/19/2017 1003   BUN 16 02/13/2018 1109   BUN 15.9 12/19/2017 1003   CREATININE 0.91 02/13/2018 1109   CREATININE 1.0 12/19/2017 1003      Component Value Date/Time   CALCIUM 10.0 02/13/2018 1109   CALCIUM 9.6 12/19/2017 1003   ALKPHOS 134 02/13/2018  1109   ALKPHOS 132 12/19/2017 1003   AST 14 02/13/2018 1109   AST 13 12/19/2017 1003   ALT 10 02/13/2018 1109   ALT 10 12/19/2017 1003   BILITOT 0.9 02/13/2018 1109   BILITOT 0.54 12/19/2017 1003     Results for KYEN, TAITE (MRN [id number]) as of 02/20/2018 12:45  Ref. Range 12/19/2017 10:03 02/13/2018 11:09  Prostate Specific Ag, Serum Latest Ref Range: 0.0 - 4.0 ng/mL <0.1 <0.1       Impression and Plan:  72 year old gentleman with the following issues:  1. 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 He was diagnosed in August of 2014 presented with a PSA 71 and a Gleason score of 4+4 equals 8. He was treated with androgen deprivation initially and subsequently developed a rise in his PSA despite castrate level testosterone.   He is currently on Xtandi 160 mg daily started on October 11, 7828 without complications.  PSA obtained on 02/13/2018 showed excellent response to therapy and continues to be undetectable. Risks and benefits of continuing this medication at the same dose and schedule was discussed today. Long-term complications associated with this medication were reviewed again including edema, fatigue, hot flashes and hypertension. After discussion today is agreeable to continue at this time.   2. Androgen deprivation: Risks and benefits associated with long-term Lupron therapy was reviewed. He is agreeable to continue with Lupron and he will receive it at 30 mg every 4 months starting on 02/20/2018.  3. Bone directed therapy: His risk of skeletal related events is low. Delton See could be an option in the future if he develops progression of disease. He is currently on calcium and vitamin D supplements.   4. Follow-up: In 8 weeks for a follow-up visit.  15 minutes was spent with the patient face-to-face today.  More than 50% of time was dedicated to patient counseling, education and coordination of his care.     Zola Button,  MD 3/1/20191:01 PM

## 2018-02-27 ENCOUNTER — Other Ambulatory Visit: Payer: Self-pay | Admitting: Oncology

## 2018-02-27 DIAGNOSIS — C61 Malignant neoplasm of prostate: Secondary | ICD-10-CM

## 2018-03-04 MED FILL — XTANDI 40 MG CAPSULE: 40 | 30 days supply | Qty: 120 | Fill #0

## 2018-03-30 ENCOUNTER — Other Ambulatory Visit: Payer: Self-pay | Admitting: Oncology

## 2018-03-30 DIAGNOSIS — C61 Malignant neoplasm of prostate: Secondary | ICD-10-CM

## 2018-04-10 ENCOUNTER — Inpatient Hospital Stay: Payer: Medicare Other | Attending: Oncology

## 2018-04-10 DIAGNOSIS — C61 Malignant neoplasm of prostate: Secondary | ICD-10-CM | POA: Insufficient documentation

## 2018-04-10 DIAGNOSIS — E291 Testicular hypofunction: Secondary | ICD-10-CM | POA: Diagnosis not present

## 2018-04-10 DIAGNOSIS — C7951 Secondary malignant neoplasm of bone: Secondary | ICD-10-CM | POA: Insufficient documentation

## 2018-04-10 LAB — CBC WITH DIFFERENTIAL (CANCER CENTER ONLY)
Basophils Absolute: 0 10*3/uL (ref 0.0–0.1)
Basophils Relative: 0 %
EOS ABS: 0.1 10*3/uL (ref 0.0–0.5)
Eosinophils Relative: 2 %
HCT: 38.4 % (ref 38.4–49.9)
HEMOGLOBIN: 12.4 g/dL — AB (ref 13.0–17.1)
LYMPHS ABS: 2.1 10*3/uL (ref 0.9–3.3)
LYMPHS PCT: 50 %
MCH: 29.7 pg (ref 27.2–33.4)
MCHC: 32.3 g/dL (ref 32.0–36.0)
MCV: 92.1 fL (ref 79.3–98.0)
Monocytes Absolute: 0.4 10*3/uL (ref 0.1–0.9)
Monocytes Relative: 8 %
NEUTROS PCT: 40 %
Neutro Abs: 1.7 10*3/uL (ref 1.5–6.5)
Platelet Count: 208 10*3/uL (ref 140–400)
RBC: 4.17 MIL/uL — AB (ref 4.20–5.82)
RDW: 14.1 % (ref 11.0–14.6)
WBC: 4.2 10*3/uL (ref 4.0–10.3)

## 2018-04-10 LAB — CMP (CANCER CENTER ONLY)
ALT: 10 U/L (ref 0–55)
AST: 11 U/L (ref 5–34)
Albumin: 3.6 g/dL (ref 3.5–5.0)
Alkaline Phosphatase: 119 U/L (ref 40–150)
Anion gap: 7 (ref 3–11)
BUN: 16 mg/dL (ref 7–26)
CHLORIDE: 105 mmol/L (ref 98–109)
CO2: 27 mmol/L (ref 22–29)
CREATININE: 0.91 mg/dL (ref 0.70–1.30)
Calcium: 9.8 mg/dL (ref 8.4–10.4)
Glucose, Bld: 78 mg/dL (ref 70–140)
POTASSIUM: 4.5 mmol/L (ref 3.5–5.1)
Sodium: 139 mmol/L (ref 136–145)
Total Bilirubin: 0.6 mg/dL (ref 0.2–1.2)
Total Protein: 7.3 g/dL (ref 6.4–8.3)

## 2018-04-10 MED FILL — XTANDI 40 MG CAPSULE: 40 | 30 days supply | Qty: 120 | Fill #0

## 2018-04-11 LAB — PROSTATE-SPECIFIC AG, SERUM (LABCORP): Prostate Specific Ag, Serum: <0.1 ng/mL (ref 0.0–4.0)

## 2018-04-17 ENCOUNTER — Telehealth: Payer: Self-pay | Admitting: Oncology

## 2018-04-17 ENCOUNTER — Inpatient Hospital Stay (HOSPITAL_BASED_OUTPATIENT_CLINIC_OR_DEPARTMENT_OTHER): Payer: Medicare Other | Admitting: Oncology

## 2018-04-17 VITALS — BP 139/81 | HR 76 | Temp 98.7°F | Resp 16 | Wt 228.5 lb

## 2018-04-17 DIAGNOSIS — C61 Malignant neoplasm of prostate: Secondary | ICD-10-CM | POA: Diagnosis not present

## 2018-04-17 DIAGNOSIS — C7951 Secondary malignant neoplasm of bone: Secondary | ICD-10-CM | POA: Diagnosis not present

## 2018-04-17 DIAGNOSIS — E291 Testicular hypofunction: Secondary | ICD-10-CM

## 2018-04-17 NOTE — Telephone Encounter (Signed)
Appointments scheduled AVS/Calendar printed per 4/26 los °

## 2018-04-17 NOTE — Progress Notes (Signed)
Hematology and Oncology Follow Up Visit  Benjamin Brown [id number] April 08, 1946 71 y.o. 04/17/2018 12:54 PM Benjamin Brown, MDHill, Berneta Sages, MD   Principle Diagnosis: 72 year old man with castration-resistant prostate cancer with his initial diagnosis in 2014 and a Gleason score 4+4 = 8 and a PSA of 71.9.  He developed castration-resistant disease in October 2018 with limited bone involvement.   Current therapy:  Lupron 30 mg injection every 4 months.   Xtandi 160 mg daily started on October 11, 2017.  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  He reports no major changes in his health or new complaints.  He continues to take [facility] without any new complications.  He does report some hot flashes and weight gain and mild fatigue.  Despite that he continues to attend activities of daily living including working full-time.  He denies any worsening bone pain or pathological fractures.  He denies any changes in his bowel habits including hematochezia or melena.  He does not report any headaches, blurry vision, syncope or seizures. He does not report any fevers, chills or sweats.  Weight is down and appetite is unchanged.  He does not report any cough, wheezing or hemoptysis. He does not report any chest pain, palpitation or orthopnea. He is not reporting nausea or vomiting or abdominal pain.  He does not report any skeletal complaints. He denies any skin rashes or lesions. He does not report any lymphadenopathy or petechiae. Remaining review of systems is negative.  Medications: I have reviewed the patient's current medications.  Current Outpatient Medications  Medication Sig Dispense Refill  . XTANDI 40 MG capsule TAKE 4 CAPSULES (160 MG TOTAL) BY MOUTH DAILY. 120 capsule 0   No current facility-administered medications for this visit.      Allergies: No Known Allergies  Past Medical History, Surgical history, Social history, and Family History were reviewed and  updated.   Physical Exam: Blood pressure 139/81, pulse 76, temperature 98.7 F (37.1 C), temperature source Oral, resp. rate 16, weight 228 lb 8 oz (103.6 kg), SpO2 94 %.   Exam: ECOG 0 General appearance: Well-appearing gentleman without distress Head: Normocephalic without any abnormalities. Oropharynx: Mucous membranes are moist and pink. Eyes: Sclera anicteric. Resp: Clear to auscultation without any rhonchi, wheezes or dullness to percussion. Cardio: Regular rate and rhythm without any murmurs or gallops. GI: Soft, nontender without any rebound or guarding. Musculoskeletal: No clubbing or cyanosis.  Full range of motion noted in all joints. Lymph nodes: No lymphadenopathy palpated in the inguinal, axillary or cervical nodes. Neurologic: No deficits noted  motor and sensory or deep tendon reflexes Skin: No ecchymosis or petechiae.   CBC    Component Value Date/Time   WBC 4.2 04/10/2018 0948   WBC 4.0 12/19/2017 1003   RBC 4.17 (L) 04/10/2018 0948   HGB 12.4 (L) 04/10/2018 0948   HGB 12.9 (L) 12/19/2017 1003   HCT 38.4 04/10/2018 0948   HCT 39.9 12/19/2017 1003   PLT 208 04/10/2018 0948   PLT 226 12/19/2017 1003   MCV 92.1 04/10/2018 0948   MCV 89.6 12/19/2017 1003   MCH 29.7 04/10/2018 0948   MCHC 32.3 04/10/2018 0948   RDW 14.1 04/10/2018 0948   RDW 14.6 12/19/2017 1003   LYMPHSABS 2.1 04/10/2018 0948   LYMPHSABS 1.9 12/19/2017 1003   MONOABS 0.4 04/10/2018 0948   MONOABS 0.3 12/19/2017 1003   EOSABS 0.1 04/10/2018 0948   EOSABS 0.1 12/19/2017 1003   BASOSABS 0.0 04/10/2018 0948  BASOSABS 0.0 12/19/2017 1003     Chemistry      Component Value Date/Time   NA 139 04/10/2018 0948   NA 142 12/19/2017 1003   K 4.5 04/10/2018 0948   K 4.6 12/19/2017 1003   CL 105 04/10/2018 0948   CO2 27 04/10/2018 0948   CO2 27 12/19/2017 1003   BUN 16 04/10/2018 0948   BUN 15.9 12/19/2017 1003   CREATININE 0.91 04/10/2018 0948   CREATININE 1.0 12/19/2017 1003       Component Value Date/Time   CALCIUM 9.8 04/10/2018 0948   CALCIUM 9.6 12/19/2017 1003   ALKPHOS 119 04/10/2018 0948   ALKPHOS 132 12/19/2017 1003   AST 11 04/10/2018 0948   AST 13 12/19/2017 1003   ALT 10 04/10/2018 0948   ALT 10 12/19/2017 1003   BILITOT 0.6 04/10/2018 0948   BILITOT 0.54 12/19/2017 1003     Results for Benjamin Brown, Benjamin Brown (MRN [id number]) as of 02/20/2018 12:45  Ref. Range 12/19/2017 10:03 02/13/2018 11:09  Prostate Specific Ag, Serum Latest Ref Range: 0.0 - 4.0 ng/mL <0.1 <0.1       Impression and Plan:  72 year old man with the following issues:  1. Castration-resistant metastatic prostate cancer diagnosed in October 2018 with limited disease involvement in the bone.  He remains on Xtandi 860 mg daily without any new complications.  His PSA continues to be undetectable without any major complications related to his cancer or cancer treatment.   Risks and benefits of continuing this medication at the current dose was discussed and he is agreeable to continue at this time.    2. Androgen deprivation: He continues to be on Lupron indefinitely.  His next Lupron injection will be given on June 12, 2018.  3. Bone directed therapy: He remains on calcium and vitamin D.  Bone directed therapy will be continued to be addressed.  4. Follow-up: In 8 weeks for a follow-up visit.  15 minutes was spent with the patient face-to-face today.  More than 50% of time was dedicated to patient counseling, education and coordination of his care.     Zola Button, MD 4/26/201912:54 PM

## 2018-05-01 ENCOUNTER — Other Ambulatory Visit: Payer: Self-pay | Admitting: Oncology

## 2018-05-01 DIAGNOSIS — C61 Malignant neoplasm of prostate: Secondary | ICD-10-CM

## 2018-05-06 MED FILL — XTANDI 40 MG CAPSULE: 40 | 30 days supply | Qty: 120 | Fill #0

## 2018-05-28 ENCOUNTER — Other Ambulatory Visit: Payer: Self-pay | Admitting: Oncology

## 2018-05-28 DIAGNOSIS — C61 Malignant neoplasm of prostate: Secondary | ICD-10-CM

## 2018-06-05 ENCOUNTER — Inpatient Hospital Stay: Payer: Medicare Other | Attending: Oncology

## 2018-06-05 DIAGNOSIS — E291 Testicular hypofunction: Secondary | ICD-10-CM | POA: Insufficient documentation

## 2018-06-05 DIAGNOSIS — C61 Malignant neoplasm of prostate: Secondary | ICD-10-CM | POA: Diagnosis not present

## 2018-06-05 DIAGNOSIS — C7951 Secondary malignant neoplasm of bone: Secondary | ICD-10-CM | POA: Insufficient documentation

## 2018-06-05 DIAGNOSIS — R351 Nocturia: Secondary | ICD-10-CM | POA: Diagnosis not present

## 2018-06-05 DIAGNOSIS — R5383 Other fatigue: Secondary | ICD-10-CM | POA: Insufficient documentation

## 2018-06-05 LAB — CMP (CANCER CENTER ONLY)
ALK PHOS: 117 U/L (ref 40–150)
ALT: 8 U/L (ref 0–55)
AST: 13 U/L (ref 5–34)
Albumin: 3.7 g/dL (ref 3.5–5.0)
Anion gap: 7 (ref 3–11)
BUN: 22 mg/dL (ref 7–26)
CHLORIDE: 105 mmol/L (ref 98–109)
CO2: 27 mmol/L (ref 22–29)
CREATININE: 0.91 mg/dL (ref 0.70–1.30)
Calcium: 9.7 mg/dL (ref 8.4–10.4)
GFR, Est AFR Am: >60 mL/min (ref >60)
Glucose, Bld: 93 mg/dL (ref 70–140)
Potassium: 4.7 mmol/L (ref 3.5–5.1)
Sodium: 139 mmol/L (ref 136–145)
Total Bilirubin: 0.6 mg/dL (ref 0.2–1.2)
Total Protein: 7.3 g/dL (ref 6.4–8.3)

## 2018-06-05 LAB — CBC WITH DIFFERENTIAL (CANCER CENTER ONLY)
BASOS ABS: 0 10*3/uL (ref 0.0–0.1)
Basophils Relative: 0 %
EOS PCT: 2 %
Eosinophils Absolute: 0.1 10*3/uL (ref 0.0–0.5)
HEMATOCRIT: 39.6 % (ref 38.4–49.9)
HEMOGLOBIN: 12.7 g/dL — AB (ref 13.0–17.1)
LYMPHS PCT: 49 %
Lymphs Abs: 2.1 10*3/uL (ref 0.9–3.3)
MCH: 29.4 pg (ref 27.2–33.4)
MCHC: 32.1 g/dL (ref 32.0–36.0)
MCV: 91.7 fL (ref 79.3–98.0)
Monocytes Absolute: 0.3 10*3/uL (ref 0.1–0.9)
Monocytes Relative: 8 %
NEUTROS ABS: 1.8 10*3/uL (ref 1.5–6.5)
NEUTROS PCT: 41 %
PLATELETS: 201 10*3/uL (ref 140–400)
RBC: 4.32 MIL/uL (ref 4.20–5.82)
RDW: 14.1 % (ref 11.0–14.6)
WBC: 4.3 10*3/uL (ref 4.0–10.3)

## 2018-06-05 MED FILL — XTANDI 40 MG CAPSULE: 40 | 30 days supply | Qty: 120 | Fill #0

## 2018-06-06 LAB — PROSTATE-SPECIFIC AG, SERUM (LABCORP): Prostate Specific Ag, Serum: <0.1 ng/mL (ref 0.0–4.0)

## 2018-06-12 ENCOUNTER — Telehealth: Payer: Self-pay

## 2018-06-12 ENCOUNTER — Inpatient Hospital Stay (HOSPITAL_BASED_OUTPATIENT_CLINIC_OR_DEPARTMENT_OTHER): Payer: Medicare Other | Admitting: Oncology

## 2018-06-12 ENCOUNTER — Inpatient Hospital Stay: Payer: Medicare Other

## 2018-06-12 ENCOUNTER — Telehealth: Payer: Self-pay | Admitting: Oncology

## 2018-06-12 VITALS — BP 141/81 | HR 71 | Temp 97.8°F | Resp 16 | Ht 65.0 in | Wt 230.5 lb

## 2018-06-12 DIAGNOSIS — C7951 Secondary malignant neoplasm of bone: Secondary | ICD-10-CM | POA: Diagnosis not present

## 2018-06-12 DIAGNOSIS — C61 Malignant neoplasm of prostate: Secondary | ICD-10-CM | POA: Diagnosis not present

## 2018-06-12 DIAGNOSIS — E291 Testicular hypofunction: Secondary | ICD-10-CM

## 2018-06-12 DIAGNOSIS — R351 Nocturia: Secondary | ICD-10-CM | POA: Diagnosis not present

## 2018-06-12 DIAGNOSIS — R5383 Other fatigue: Secondary | ICD-10-CM

## 2018-06-12 NOTE — Telephone Encounter (Signed)
Per 6/21 los completed by Daleen Snook and Deatra Canter

## 2018-06-12 NOTE — Progress Notes (Signed)
Hematology and Oncology Follow Up Visit  Benjamin Brown [id number] June 15, 1946 71 y.o. 06/12/2018 11:38 AM Benjamin Brown, MDHill, Berneta Sages, MD   Principle Diagnosis: 72 year old man with castration-resistant prostate cancer with limited bone disease documented in 2018.  He was diagnosed in 2014 with Gleason score 4+4 = 8 and a PSA of 71.9.    Current therapy:  Lupron 30 mg injection every 4 months.   Xtandi 160 mg daily started on October 11, 2017.  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  Since the last visit, he reports no changes in his health.  He does report mild fatigue and mild nocturia.  He is able to perform all activities of daily living including work full-time.  He still ambulates without any falls or syncope.  Is able to drive without any issues.  He denies any worsening back pain or any pathological fractures.  He has no new issues associated with Xtandi.  He does report hot flashes at times related to Lupron.  He does not report any headaches, blurry vision, syncope or seizures.  He denies any alteration in mental status or confusion.  He does not report any fevers, chills or sweats.  His appetite and weight is unchanged.  He does not report any cough, wheezing or hemoptysis. He does not report any chest pain, palpitation or orthopnea.  He denies any nausea, vomiting or abdominal pain.  He denies any hematochezia or melena.  He does not report any arthralgias or myalgias.  He denies any skin rashes or lesions. He does not report any lymphadenopathy or petechiae. Remaining review of systems is negative.  Medications: I have reviewed the patient's current medications.  Current Outpatient Medications  Medication Sig Dispense Refill  . XTANDI 40 MG capsule TAKE 4 CAPSULES (160 MG TOTAL) BY MOUTH DAILY. 120 capsule 0   No current facility-administered medications for this visit.      Allergies: No Known Allergies  Past Medical History, Surgical history, Social  history, and Family History were reviewed and updated.   Physical Exam: Blood pressure (!) 141/81, pulse 71, temperature 97.8 F (36.6 C), temperature source Oral, resp. rate 16, height 5\' 5"  (1.651 m), weight 230 lb 8 oz (104.6 kg), SpO2 99 %.   ECOG 0 General appearance: Alert, awake gentleman without distress Head: Atraumatic without abnormalities. Oropharynx: No oral thrush or ulcers. Eyes: Sclera anicteric.  Resp: Clear in all lung fields without any wheezes or dullness to percussion.   Cardio: Regular rate and rhythm without any murmurs.  S1 and S2 no leg edema. GI: Soft, without any rebound or guarding.  Good bowel sounds. Musculoskeletal: No clubbing or cyanosis. Lymph nodes: No cervical, inguinal, axillary or supraclavicular lymphadenopathy. Neurologic: No motor or sensory deficits.  Normal gait. Skin: No skin rashes or lesions.   CBC    Component Value Date/Time   WBC 4.3 06/05/2018 1022   WBC 4.0 12/19/2017 1003   RBC 4.32 06/05/2018 1022   HGB 12.7 (L) 06/05/2018 1022   HGB 12.9 (L) 12/19/2017 1003   HCT 39.6 06/05/2018 1022   HCT 39.9 12/19/2017 1003   PLT 201 06/05/2018 1022   PLT 226 12/19/2017 1003   MCV 91.7 06/05/2018 1022   MCV 89.6 12/19/2017 1003   MCH 29.4 06/05/2018 1022   MCHC 32.1 06/05/2018 1022   RDW 14.1 06/05/2018 1022   RDW 14.6 12/19/2017 1003   LYMPHSABS 2.1 06/05/2018 1022   LYMPHSABS 1.9 12/19/2017 1003   MONOABS 0.3 06/05/2018  1022   MONOABS 0.3 12/19/2017 1003   EOSABS 0.1 06/05/2018 1022   EOSABS 0.1 12/19/2017 1003   BASOSABS 0.0 06/05/2018 1022   BASOSABS 0.0 12/19/2017 1003     Chemistry      Component Value Date/Time   NA 139 06/05/2018 1022   NA 142 12/19/2017 1003   K 4.7 06/05/2018 1022   K 4.6 12/19/2017 1003   CL 105 06/05/2018 1022   CO2 27 06/05/2018 1022   CO2 27 12/19/2017 1003   BUN 22 06/05/2018 1022   BUN 15.9 12/19/2017 1003   CREATININE 0.91 06/05/2018 1022   CREATININE 1.0 12/19/2017 1003       Component Value Date/Time   CALCIUM 9.7 06/05/2018 1022   CALCIUM 9.6 12/19/2017 1003   ALKPHOS 117 06/05/2018 1022   ALKPHOS 132 12/19/2017 1003   AST 13 06/05/2018 1022   AST 13 12/19/2017 1003   ALT 8 06/05/2018 1022   ALT 10 12/19/2017 1003   BILITOT 0.6 06/05/2018 1022   BILITOT 0.54 12/19/2017 1003      Results for Benjamin Brown (MRN [id number]) as of 06/12/2018 11:20  Ref. Range 04/10/2018 09:48 06/05/2018 10:22  Prostate Specific Ag, Serum Latest Ref Range: 0.0 - 4.0 ng/mL <0.1 <0.1       Impression and Plan:  72 year old man with:  1. Castration-resistant prostate cancer with limited disease to the bone documented in October 2018.  His initial diagnosis was in 2014.    He has been on Xtandi at 160 mg daily without new complications.  He does report mild fatigue and hot flashes but symptoms are manageable.  His PSA was reviewed today and continues to be undetectable.  Risks and benefits of continuing this therapy was reviewed today and is agreeable to continue.   2. Androgen deprivation: His next Lupron will be due after July 1 and for better convenience and will be administered with his next visit in August 2019.  3. Bone directed therapy: He is on calcium and vitamin D with very limited bone disease.  Bone directed therapy in the form of Xgeva or Zometa may be a consideration in the future.  4. Follow-up: In 8 weeks for a follow-up visit.  15 minutes was spent with the patient face-to-face today.  More than 50% of time was dedicated to patient counseling, education and answering questions regarding future plan of care.     Zola Button, MD 6/21/201911:38 AM

## 2018-06-12 NOTE — Telephone Encounter (Signed)
Per 6/21 los.  Gave pt AVS and Calendar.

## 2018-07-01 ENCOUNTER — Other Ambulatory Visit: Payer: Self-pay | Admitting: Oncology

## 2018-07-01 DIAGNOSIS — C61 Malignant neoplasm of prostate: Secondary | ICD-10-CM

## 2018-07-10 MED FILL — XTANDI 40 MG CAPSULE: 40 | 30 days supply | Qty: 120 | Fill #0

## 2018-07-31 ENCOUNTER — Other Ambulatory Visit: Payer: Self-pay | Admitting: Oncology

## 2018-07-31 DIAGNOSIS — C61 Malignant neoplasm of prostate: Secondary | ICD-10-CM

## 2018-08-07 ENCOUNTER — Inpatient Hospital Stay: Payer: Medicare Other | Attending: Oncology

## 2018-08-07 DIAGNOSIS — C61 Malignant neoplasm of prostate: Secondary | ICD-10-CM

## 2018-08-07 DIAGNOSIS — C7951 Secondary malignant neoplasm of bone: Secondary | ICD-10-CM | POA: Insufficient documentation

## 2018-08-07 DIAGNOSIS — Z5111 Encounter for antineoplastic chemotherapy: Secondary | ICD-10-CM | POA: Diagnosis not present

## 2018-08-07 LAB — CMP (CANCER CENTER ONLY)
ALT: 7 U/L (ref 0–44)
ANION GAP: 6 (ref 5–15)
AST: 11 U/L — ABNORMAL LOW (ref 15–41)
Albumin: 3.5 g/dL (ref 3.5–5.0)
Alkaline Phosphatase: 126 U/L (ref 38–126)
BILIRUBIN TOTAL: 0.8 mg/dL (ref 0.3–1.2)
BUN: 19 mg/dL (ref 8–23)
CO2: 26 mmol/L (ref 22–32)
Calcium: 9.3 mg/dL (ref 8.9–10.3)
Chloride: 106 mmol/L (ref 98–111)
Creatinine: 0.86 mg/dL (ref 0.61–1.24)
GLUCOSE: 89 mg/dL (ref 70–99)
Potassium: 4.6 mmol/L (ref 3.5–5.1)
Sodium: 138 mmol/L (ref 135–145)
TOTAL PROTEIN: 7.3 g/dL (ref 6.5–8.1)

## 2018-08-07 LAB — CBC WITH DIFFERENTIAL (CANCER CENTER ONLY)
BASOS PCT: 0 %
Basophils Absolute: 0 10*3/uL (ref 0.0–0.1)
Eosinophils Absolute: 0.1 10*3/uL (ref 0.0–0.5)
Eosinophils Relative: 3 %
HEMATOCRIT: 39 % (ref 38.4–49.9)
HEMOGLOBIN: 12.9 g/dL — AB (ref 13.0–17.1)
Lymphocytes Relative: 56 %
Lymphs Abs: 2.4 10*3/uL (ref 0.9–3.3)
MCH: 30.2 pg (ref 27.2–33.4)
MCHC: 33.1 g/dL (ref 32.0–36.0)
MCV: 91.3 fL (ref 79.3–98.0)
MONO ABS: 0.3 10*3/uL (ref 0.1–0.9)
Monocytes Relative: 8 %
NEUTROS ABS: 1.4 10*3/uL — AB (ref 1.5–6.5)
NEUTROS PCT: 33 %
Platelet Count: 209 10*3/uL (ref 140–400)
RBC: 4.27 MIL/uL (ref 4.20–5.82)
RDW: 13.7 % (ref 11.0–14.6)
WBC: 4.2 10*3/uL (ref 4.0–10.3)

## 2018-08-07 MED FILL — XTANDI 40 MG CAPSULE: 40 | 30 days supply | Qty: 120 | Fill #0

## 2018-08-08 LAB — PROSTATE-SPECIFIC AG, SERUM (LABCORP): Prostate Specific Ag, Serum: <0.1 ng/mL (ref 0.0–4.0)

## 2018-08-14 ENCOUNTER — Telehealth: Payer: Self-pay

## 2018-08-14 ENCOUNTER — Inpatient Hospital Stay: Payer: Medicare Other

## 2018-08-14 ENCOUNTER — Inpatient Hospital Stay (HOSPITAL_BASED_OUTPATIENT_CLINIC_OR_DEPARTMENT_OTHER): Payer: Medicare Other | Admitting: Oncology

## 2018-08-14 VITALS — BP 147/85 | HR 68 | Temp 98.7°F | Resp 18 | Ht 65.0 in | Wt 232.9 lb

## 2018-08-14 DIAGNOSIS — C61 Malignant neoplasm of prostate: Secondary | ICD-10-CM | POA: Diagnosis not present

## 2018-08-14 DIAGNOSIS — R350 Frequency of micturition: Secondary | ICD-10-CM

## 2018-08-14 DIAGNOSIS — Z5111 Encounter for antineoplastic chemotherapy: Secondary | ICD-10-CM | POA: Diagnosis not present

## 2018-08-14 DIAGNOSIS — E291 Testicular hypofunction: Secondary | ICD-10-CM

## 2018-08-14 DIAGNOSIS — C7951 Secondary malignant neoplasm of bone: Secondary | ICD-10-CM

## 2018-08-14 DIAGNOSIS — M545 Low back pain: Secondary | ICD-10-CM | POA: Diagnosis not present

## 2018-08-14 MED ORDER — LEUPROLIDE ACETATE (4 MONTH) 30 MG IM KIT
30.0000 mg | PACK | Freq: Once | INTRAMUSCULAR | Status: AC
  Administered 2018-08-14: 30 mg via INTRAMUSCULAR
  Filled 2018-08-14: qty 30

## 2018-08-14 NOTE — Telephone Encounter (Signed)
Printed avs and calender of upcoming appointment. Per 8/23 los 

## 2018-08-14 NOTE — Patient Instructions (Signed)
Leuprolide depot injection What is this medicine? LEUPROLIDE (loo PROE lide) is a man-made protein that acts like a natural hormone in the body. It decreases testosterone in men and decreases estrogen in women. In men, this medicine is used to treat advanced prostate cancer. In women, some forms of this medicine may be used to treat endometriosis, uterine fibroids, or other male hormone-related problems. This medicine may be used for other purposes; ask your health care provider or pharmacist if you have questions. COMMON BRAND NAME(S): Eligard, Lupron Depot, Lupron Depot-Ped, Viadur What should I tell my health care provider before I take this medicine? They need to know if you have any of these conditions: -diabetes -heart disease or previous heart attack -high blood pressure -high cholesterol -mental illness -osteoporosis -pain or difficulty passing urine -seizures -spinal cord metastasis -stroke -suicidal thoughts, plans, or attempt; a previous suicide attempt by you or a family member -tobacco smoker -unusual vaginal bleeding (women) -an unusual or allergic reaction to leuprolide, benzyl alcohol, other medicines, foods, dyes, or preservatives -pregnant or trying to get pregnant -breast-feeding How should I use this medicine? This medicine is for injection into a muscle or for injection under the skin. It is given by a health care professional in a hospital or clinic setting. The specific product will determine how it will be given to you. Make sure you understand which product you receive and how often you will receive it. Talk to your pediatrician regarding the use of this medicine in children. Special care may be needed. Overdosage: If you think you have taken too much of this medicine contact a poison control center or emergency room at once. NOTE: This medicine is only for you. Do not share this medicine with others. What if I miss a dose? It is important not to miss a dose.  Call your doctor or health care professional if you are unable to keep an appointment. Depot injections: Depot injections are given either once-monthly, every 12 weeks, every 16 weeks, or every 24 weeks depending on the product you are prescribed. The product you are prescribed will be based on if you are male or male, and your condition. Make sure you understand your product and dosing. What may interact with this medicine? Do not take this medicine with any of the following medications: -chasteberry This medicine may also interact with the following medications: -herbal or dietary supplements, like black cohosh or DHEA -male hormones, like estrogens or progestins and birth control pills, patches, rings, or injections -male hormones, like testosterone This list may not describe all possible interactions. Give your health care provider a list of all the medicines, herbs, non-prescription drugs, or dietary supplements you use. Also tell them if you smoke, drink alcohol, or use illegal drugs. Some items may interact with your medicine. What should I watch for while using this medicine? Visit your doctor or health care professional for regular checks on your progress. During the first weeks of treatment, your symptoms may get worse, but then will improve as you continue your treatment. You may get hot flashes, increased bone pain, increased difficulty passing urine, or an aggravation of nerve symptoms. Discuss these effects with your doctor or health care professional, some of them may improve with continued use of this medicine. Male patients may experience a menstrual cycle or spotting during the first months of therapy with this medicine. If this continues, contact your doctor or health care professional. What side effects may I notice from receiving this medicine? Side   effects that you should report to your doctor or health care professional as soon as possible: -allergic reactions like skin  rash, itching or hives, swelling of the face, lips, or tongue -breathing problems -chest pain -depression or memory disorders -pain in your legs or groin -pain at site where injected or implanted -seizures -severe headache -swelling of the feet and legs -suicidal thoughts or other mood changes -visual changes -vomiting Side effects that usually do not require medical attention (report to your doctor or health care professional if they continue or are bothersome): -breast swelling or tenderness -decrease in sex drive or performance -diarrhea -hot flashes -loss of appetite -muscle, joint, or bone pains -nausea -redness or irritation at site where injected or implanted -skin problems or acne This list may not describe all possible side effects. Call your doctor for medical advice about side effects. You may report side effects to FDA at 1-800-FDA-1088. Where should I keep my medicine? This drug is given in a hospital or clinic and will not be stored at home. NOTE: This sheet is a summary. It may not cover all possible information. If you have questions about this medicine, talk to your doctor, pharmacist, or health care provider.  2018 Elsevier/Gold Standard (2016-05-23 09:45:53)  

## 2018-08-14 NOTE — Progress Notes (Signed)
Hematology and Oncology Follow Up Visit  Benjamin Brown [id number] [birth year] 72 y.o. 08/14/2018 8:45 AM [name], MDHill, Berneta Sages, MD   Principle Diagnosis: 72 year old man with cancer diagnosed in 2014 with Gleason score 4+4 = 8 and a PSA of 71.9.  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    Current therapy:  Lupron 30 mg injection every 4 months.   Xtandi 160 mg daily started on October 11, 2017.  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  Since the last visit, he reports no major changes in his health.  He does report some mild fatigue and tiredness that is manageable at this time.  He continues to attend to activities of daily living including working full-time without any hindrance or decline.  He does report to have frequent urination but no dysuria or hematuria.  His back pain is not changed at this time and also manageable without any medication.  He denies any other complications related to Xtandi at this time.  He had no issues with Lupron at this time.  He does not report any headaches, blurry vision, syncope or seizures.  He denies any this or lethargy.  He does not report any fevers, chills or sweats.  He does not report any cough, wheezing or hemoptysis. He does not report any chest pain, palpitation or orthopnea.  He denies any nausea, vomiting or bowel habits.  Denies any early satiety or abdominal distention. He does not report any bone pain or pathological fractures.  He denies any ecchymosis or petechiae.  He does not report any skin rashes or lesions.  Remaining review of systems is negative.  Medications: I have reviewed the patient's current medications.  Current Outpatient Medications  Medication Sig Dispense Refill  . XTANDI 40 MG capsule TAKE 4 CAPSULES (160 MG TOTAL) BY MOUTH DAILY. 120 capsule 0   No current facility-administered medications for this visit.      Allergies: No Known  Allergies  Past Medical History, Surgical history, Social history, and Family History were reviewed and updated.   Physical Exam:   ECOG 0  Blood pressure (!) 147/85, pulse 68, temperature 98.7 F (37.1 C), temperature source Oral, resp. rate 18, height 5\' 5"  (1.651 m), weight 232 lb 14.4 oz (105.6 kg), SpO2 99 %.   General appearance: Comfortable appearing without any discomfort Head: Normocephalic without any trauma Oropharynx: Mucous membranes are moist and pink without any thrush or ulcers. Eyes: Pupils are equal and round reactive to light. Lymph nodes: No cervical, supraclavicular, inguinal or axillary lymphadenopathy.   Heart:regular rate and rhythm.  S1 and S2 without leg edema. Lung: Clear without any rhonchi or wheezes.  No dullness to percussion. Abdomin: Soft, nontender, nondistended with good bowel sounds.  No hepatosplenomegaly. Musculoskeletal: No joint deformity or effusion.  Full range of motion noted. Neurological: No deficits noted on motor, sensory and deep tendon reflex exam. Skin: No petechial rash or dryness.  Appeared moist.  Psychiatric: Mood and affect appeared appropriate.     CBC    Component Value Date/Time   WBC 4.2 08/07/2018 0929   WBC 4.0 12/19/2017 1003   RBC 4.27 08/07/2018 0929   HGB 12.9 (L) 08/07/2018 0929   HGB 12.9 (L) 12/19/2017 1003   HCT 39.0 08/07/2018 0929   HCT 39.9 12/19/2017 1003   PLT 209 08/07/2018 0929   PLT 226 12/19/2017 1003   MCV 91.3 08/07/2018 0929   MCV 89.6 12/19/2017 1003  MCH 30.2 08/07/2018 0929   MCHC 33.1 08/07/2018 0929   RDW 13.7 08/07/2018 0929   RDW 14.6 12/19/2017 1003   LYMPHSABS 2.4 08/07/2018 0929   LYMPHSABS 1.9 12/19/2017 1003   MONOABS 0.3 08/07/2018 0929   MONOABS 0.3 12/19/2017 1003   EOSABS 0.1 08/07/2018 0929   EOSABS 0.1 12/19/2017 1003   BASOSABS 0.0 08/07/2018 0929   BASOSABS 0.0 12/19/2017 1003     Chemistry      Component Value Date/Time   NA 138 08/07/2018 0929   NA 142  12/19/2017 1003   K 4.6 08/07/2018 0929   K 4.6 12/19/2017 1003   CL 106 08/07/2018 0929   CO2 26 08/07/2018 0929   CO2 27 12/19/2017 1003   BUN 19 08/07/2018 0929   BUN 15.9 12/19/2017 1003   CREATININE 0.86 08/07/2018 0929   CREATININE 1.0 12/19/2017 1003      Component Value Date/Time   CALCIUM 9.3 08/07/2018 0929   CALCIUM 9.6 12/19/2017 1003   ALKPHOS 126 08/07/2018 0929   ALKPHOS 132 12/19/2017 1003   AST 11 (L) 08/07/2018 0929   AST 13 12/19/2017 1003   ALT 7 08/07/2018 0929   ALT 10 12/19/2017 1003   BILITOT 0.8 08/07/2018 0929   BILITOT 0.54 12/19/2017 1003        Results for ZYIER, DYKEMA (MRN [id number]) as of 08/14/2018 08:46  Ref. Range 06/05/2018 10:22 08/07/2018 09:29  Prostate Specific Ag, Serum Latest Ref Range: 0.0 - 4.0 ng/mL <0.1 <0.1      Impression and Plan:  72 year old man with:  1.  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    He continues to tolerate Xtandi without any major complications.  He does have mild fatigue which has been manageable.  His PSA remains undetectable with the current therapy.  Risks and benefits of continuing this treatment long-term was reviewed.  He is agreeable to continue at this time.  Dose reductions have been discussed but he is okay to continue with the same dose and schedule.   2. Androgen deprivation: Lupron given on February 20, 2018 and he will receive it on August 14, 2018.  This will be repeated every 4 to 6 months.  3. Bone directed therapy: He is on calcium and vitamin D  4. Follow-up: In 4 months.  15 minutes was spent with the patient face-to-face today.  More than 50% of time was dedicated to discussing the natural course of this disease, reviewing laboratory data and reviewing alternative future therapies.     Zola Button, MD 8/23/20198:45 AM

## 2018-08-26 ENCOUNTER — Encounter: Payer: Self-pay | Admitting: Pharmacist

## 2018-08-28 ENCOUNTER — Other Ambulatory Visit: Payer: Self-pay | Admitting: Oncology

## 2018-08-28 DIAGNOSIS — C61 Malignant neoplasm of prostate: Secondary | ICD-10-CM

## 2018-09-11 MED FILL — XTANDI 40 MG CAPSULE: 40 | 30 days supply | Qty: 120 | Fill #0

## 2018-10-05 ENCOUNTER — Other Ambulatory Visit: Payer: Self-pay | Admitting: Oncology

## 2018-10-05 DIAGNOSIS — C61 Malignant neoplasm of prostate: Secondary | ICD-10-CM

## 2018-10-09 MED FILL — XTANDI 40 MG CAPSULE: 40 | 30 days supply | Qty: 120 | Fill #0

## 2018-10-30 ENCOUNTER — Other Ambulatory Visit: Payer: Self-pay | Admitting: Oncology

## 2018-10-30 DIAGNOSIS — C61 Malignant neoplasm of prostate: Secondary | ICD-10-CM

## 2018-11-06 MED FILL — XTANDI 40 MG CAPSULE: 40 | 30 days supply | Qty: 120 | Fill #0

## 2018-11-10 ENCOUNTER — Other Ambulatory Visit: Payer: Self-pay

## 2018-11-27 ENCOUNTER — Other Ambulatory Visit: Payer: Self-pay | Admitting: Oncology

## 2018-11-27 DIAGNOSIS — C61 Malignant neoplasm of prostate: Secondary | ICD-10-CM

## 2018-12-04 ENCOUNTER — Inpatient Hospital Stay: Payer: Medicare Other | Attending: Oncology

## 2018-12-04 DIAGNOSIS — Z5111 Encounter for antineoplastic chemotherapy: Secondary | ICD-10-CM | POA: Insufficient documentation

## 2018-12-04 DIAGNOSIS — C61 Malignant neoplasm of prostate: Secondary | ICD-10-CM | POA: Insufficient documentation

## 2018-12-04 DIAGNOSIS — C7951 Secondary malignant neoplasm of bone: Secondary | ICD-10-CM | POA: Insufficient documentation

## 2018-12-04 DIAGNOSIS — E291 Testicular hypofunction: Secondary | ICD-10-CM | POA: Insufficient documentation

## 2018-12-04 LAB — CBC WITH DIFFERENTIAL (CANCER CENTER ONLY)
ABS IMMATURE GRANULOCYTES: 0.01 10*3/uL (ref 0.00–0.07)
BASOS ABS: 0 10*3/uL (ref 0.0–0.1)
Basophils Relative: 0 %
EOS PCT: 3 %
Eosinophils Absolute: 0.1 10*3/uL (ref 0.0–0.5)
HEMATOCRIT: 41.2 % (ref 39.0–52.0)
Hemoglobin: 13.5 g/dL (ref 13.0–17.0)
Immature Granulocytes: 0 %
LYMPHS ABS: 2 10*3/uL (ref 0.7–4.0)
Lymphocytes Relative: 47 %
MCH: 30 pg (ref 26.0–34.0)
MCHC: 32.8 g/dL (ref 30.0–36.0)
MCV: 91.6 fL (ref 80.0–100.0)
MONO ABS: 0.3 10*3/uL (ref 0.1–1.0)
Monocytes Relative: 8 %
NEUTROS ABS: 1.7 10*3/uL (ref 1.7–7.7)
Neutrophils Relative %: 42 %
Platelet Count: 214 10*3/uL (ref 150–400)
RBC: 4.5 MIL/uL (ref 4.22–5.81)
RDW: 13.1 % (ref 11.5–15.5)
WBC Count: 4.1 10*3/uL (ref 4.0–10.5)
nRBC: 0 % (ref 0.0–0.2)

## 2018-12-04 LAB — CMP (CANCER CENTER ONLY)
ALBUMIN: 3.5 g/dL (ref 3.5–5.0)
ALK PHOS: 137 U/L — AB (ref 38–126)
ALT: 6 U/L (ref 0–44)
ANION GAP: 8 (ref 5–15)
AST: 11 U/L — ABNORMAL LOW (ref 15–41)
BILIRUBIN TOTAL: 0.8 mg/dL (ref 0.3–1.2)
BUN: 13 mg/dL (ref 8–23)
CALCIUM: 9.7 mg/dL (ref 8.9–10.3)
CO2: 26 mmol/L (ref 22–32)
CREATININE: 0.97 mg/dL (ref 0.61–1.24)
Chloride: 106 mmol/L (ref 98–111)
GFR, Est AFR Am: >60 mL/min (ref >60)
GFR, Estimated: >60 mL/min (ref >60)
GLUCOSE: 101 mg/dL — AB (ref 70–99)
Potassium: 4.6 mmol/L (ref 3.5–5.1)
Sodium: 140 mmol/L (ref 135–145)
TOTAL PROTEIN: 7.5 g/dL (ref 6.5–8.1)

## 2018-12-04 MED FILL — XTANDI 40 MG CAPSULE: 40 | 30 days supply | Qty: 120 | Fill #0

## 2018-12-05 LAB — PROSTATE-SPECIFIC AG, SERUM (LABCORP): Prostate Specific Ag, Serum: <0.1 ng/mL (ref 0.0–4.0)

## 2018-12-11 ENCOUNTER — Telehealth: Payer: Self-pay

## 2018-12-11 ENCOUNTER — Inpatient Hospital Stay: Payer: Medicare Other

## 2018-12-11 ENCOUNTER — Inpatient Hospital Stay (HOSPITAL_BASED_OUTPATIENT_CLINIC_OR_DEPARTMENT_OTHER): Payer: Medicare Other | Admitting: Oncology

## 2018-12-11 VITALS — BP 159/79 | HR 69 | Temp 97.6°F | Resp 18 | Ht 65.0 in | Wt 230.5 lb

## 2018-12-11 DIAGNOSIS — E291 Testicular hypofunction: Secondary | ICD-10-CM | POA: Diagnosis not present

## 2018-12-11 DIAGNOSIS — C61 Malignant neoplasm of prostate: Secondary | ICD-10-CM | POA: Diagnosis not present

## 2018-12-11 DIAGNOSIS — C7951 Secondary malignant neoplasm of bone: Secondary | ICD-10-CM

## 2018-12-11 DIAGNOSIS — Z5111 Encounter for antineoplastic chemotherapy: Secondary | ICD-10-CM | POA: Diagnosis not present

## 2018-12-11 MED ORDER — LEUPROLIDE ACETATE (4 MONTH) 30 MG IM KIT
30.0000 mg | PACK | Freq: Once | INTRAMUSCULAR | Status: AC
  Administered 2018-12-11: 30 mg via INTRAMUSCULAR
  Filled 2018-12-11: qty 30

## 2018-12-11 NOTE — Patient Instructions (Signed)
Leuprolide depot injection What is this medicine? LEUPROLIDE (loo PROE lide) is a man-made protein that acts like a natural hormone in the body. It decreases testosterone in men and decreases estrogen in women. In men, this medicine is used to treat advanced prostate cancer. In women, some forms of this medicine may be used to treat endometriosis, uterine fibroids, or other male hormone-related problems. This medicine may be used for other purposes; ask your health care provider or pharmacist if you have questions. COMMON BRAND NAME(S): Eligard, Lupron Depot, Lupron Depot-Ped, Viadur What should I tell my health care provider before I take this medicine? They need to know if you have any of these conditions: -diabetes -heart disease or previous heart attack -high blood pressure -high cholesterol -mental illness -osteoporosis -pain or difficulty passing urine -seizures -spinal cord metastasis -stroke -suicidal thoughts, plans, or attempt; a previous suicide attempt by you or a family member -tobacco smoker -unusual vaginal bleeding (women) -an unusual or allergic reaction to leuprolide, benzyl alcohol, other medicines, foods, dyes, or preservatives -pregnant or trying to get pregnant -breast-feeding How should I use this medicine? This medicine is for injection into a muscle or for injection under the skin. It is given by a health care professional in a hospital or clinic setting. The specific product will determine how it will be given to you. Make sure you understand which product you receive and how often you will receive it. Talk to your pediatrician regarding the use of this medicine in children. Special care may be needed. Overdosage: If you think you have taken too much of this medicine contact a poison control center or emergency room at once. NOTE: This medicine is only for you. Do not share this medicine with others. What if I miss a dose? It is important not to miss a dose.  Call your doctor or health care professional if you are unable to keep an appointment. Depot injections: Depot injections are given either once-monthly, every 12 weeks, every 16 weeks, or every 24 weeks depending on the product you are prescribed. The product you are prescribed will be based on if you are male or male, and your condition. Make sure you understand your product and dosing. What may interact with this medicine? Do not take this medicine with any of the following medications: -chasteberry This medicine may also interact with the following medications: -herbal or dietary supplements, like black cohosh or DHEA -male hormones, like estrogens or progestins and birth control pills, patches, rings, or injections -male hormones, like testosterone This list may not describe all possible interactions. Give your health care provider a list of all the medicines, herbs, non-prescription drugs, or dietary supplements you use. Also tell them if you smoke, drink alcohol, or use illegal drugs. Some items may interact with your medicine. What should I watch for while using this medicine? Visit your doctor or health care professional for regular checks on your progress. During the first weeks of treatment, your symptoms may get worse, but then will improve as you continue your treatment. You may get hot flashes, increased bone pain, increased difficulty passing urine, or an aggravation of nerve symptoms. Discuss these effects with your doctor or health care professional, some of them may improve with continued use of this medicine. Male patients may experience a menstrual cycle or spotting during the first months of therapy with this medicine. If this continues, contact your doctor or health care professional. What side effects may I notice from receiving this medicine? Side   effects that you should report to your doctor or health care professional as soon as possible: -allergic reactions like skin  rash, itching or hives, swelling of the face, lips, or tongue -breathing problems -chest pain -depression or memory disorders -pain in your legs or groin -pain at site where injected or implanted -seizures -severe headache -swelling of the feet and legs -suicidal thoughts or other mood changes -visual changes -vomiting Side effects that usually do not require medical attention (report to your doctor or health care professional if they continue or are bothersome): -breast swelling or tenderness -decrease in sex drive or performance -diarrhea -hot flashes -loss of appetite -muscle, joint, or bone pains -nausea -redness or irritation at site where injected or implanted -skin problems or acne This list may not describe all possible side effects. Call your doctor for medical advice about side effects. You may report side effects to FDA at 1-800-FDA-1088. Where should I keep my medicine? This drug is given in a hospital or clinic and will not be stored at home. NOTE: This sheet is a summary. It may not cover all possible information. If you have questions about this medicine, talk to your doctor, pharmacist, or health care provider.  2018 Elsevier/Gold Standard (2016-05-23 09:45:53)  

## 2018-12-11 NOTE — Telephone Encounter (Signed)
Printed avs and calender of upcoming appointment. Per 12/20 los 

## 2018-12-11 NOTE — Progress Notes (Signed)
Hematology and Oncology Follow Up Visit  Benjamin Brown [id number] March 30, 1946 72 y.o. 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  He was initially diagnosed in 2014 with Gleason score 4+4 = 8 and a PSA of 71.9.    Current therapy:  Lupron 30 mg injection every 4 months.   Xtandi 160 mg daily started on October 11, 2017.  Interim History:  Benjamin Brown returns today for a repeat evaluation.  Since the last visit, he reports no major changes in his health.  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  He does report some hot flashes and weight gain associated with Lupron but no new complications related to [place].  He denies any hematuria or changes in performance status activity level.  He denies any worsening back pain or discomfort.  Quality of life remains unchanged.  He does not report any headaches, blurry vision, syncope or seizures.  He denies any alteration mental status or confusion.  He does not report any fevers, chills or sweats.  He does not report any cough, wheezing or hemoptysis. He does not report any chest pain, palpitation or orthopnea.  He denies any nausea, vomiting or early satiety.  He denies any constipation or diarrhea.  He denies any arthralgias or myalgias.  He denies any bleeding or clotting tendency.  He denies any mood changes.  Denies any heat or cold intolerance. Remaining review of systems is negative.  Medications: I have reviewed the patient's current medications.  Current Outpatient Medications  Medication Sig Dispense Refill  . XTANDI 40 MG capsule TAKE 4 CAPSULES (160 MG TOTAL) BY MOUTH DAILY. 120 capsule 0   No current facility-administered medications for this visit.      Allergies: No Known Allergies  Past Medical History, Surgical history, Social history, and Family History were reviewed  and updated.   Physical Exam:  Blood pressure (!) 159/79, pulse 69, temperature 97.6 F (36.4 C), temperature source Oral, resp. rate 18, height 5\' 5"  (1.651 m), weight 230 lb 8 oz (104.6 kg), SpO2 99 %.   ECOG 0   General appearance: Alert, awake without any distress. Head: Atraumatic without abnormalities Oropharynx: Without any thrush or ulcers. Eyes: No scleral icterus. Lymph nodes: No lymphadenopathy noted in the cervical, supraclavicular, or axillary nodes Heart:regular rate and rhythm, without any murmurs or gallops.   Lung: Clear to auscultation without any rhonchi, wheezes or dullness to percussion. Abdomin: Soft, nontender without any shifting dullness or ascites. Musculoskeletal: No clubbing or cyanosis. Neurological: No motor or sensory deficits. Skin: No rashes or lesions.      CBC    Component Value Date/Time   WBC 4.1 12/04/2018 1029   WBC 4.0 12/19/2017 1003   RBC 4.50 12/04/2018 1029   HGB 13.5 12/04/2018 1029   HGB 12.9 (L) 12/19/2017 1003   HCT 41.2 12/04/2018 1029   HCT 39.9 12/19/2017 1003   PLT 214 12/04/2018 1029   PLT 226 12/19/2017 1003   MCV 91.6 12/04/2018 1029   MCV 89.6 12/19/2017 1003   MCH 30.0 12/04/2018 1029   MCHC 32.8 12/04/2018 1029   RDW 13.1 12/04/2018 1029   RDW 14.6 12/19/2017 1003   LYMPHSABS 2.0 12/04/2018 1029   LYMPHSABS 1.9 12/19/2017 1003   MONOABS 0.3 12/04/2018 1029   MONOABS 0.3 12/19/2017 1003   EOSABS 0.1 12/04/2018 1029   EOSABS 0.1 12/19/2017  1003   BASOSABS 0.0 12/04/2018 1029   BASOSABS 0.0 12/19/2017 1003     Chemistry      Component Value Date/Time   NA 140 12/04/2018 1029   NA 142 12/19/2017 1003   K 4.6 12/04/2018 1029   K 4.6 12/19/2017 1003   CL 106 12/04/2018 1029   CO2 26 12/04/2018 1029   CO2 27 12/19/2017 1003   BUN 13 12/04/2018 1029   BUN 15.9 12/19/2017 1003   CREATININE 0.97 12/04/2018 1029   CREATININE 1.0 12/19/2017 1003      Component Value Date/Time   CALCIUM 9.7 12/04/2018  1029   CALCIUM 9.6 12/19/2017 1003   ALKPHOS 137 (H) 12/04/2018 1029   ALKPHOS 132 12/19/2017 1003   AST 11 (L) 12/04/2018 1029   AST 13 12/19/2017 1003   ALT 6 12/04/2018 1029   ALT 10 12/19/2017 1003   BILITOT 0.8 12/04/2018 1029   BILITOT 0.54 12/19/2017 1003        Results for Benjamin Brown (MRN [id number]) as of 12/11/2018 10:14  Ref. Range 06/05/2018 10:22 08/07/2018 09:29 12/04/2018 10:29  Prostate Specific Ag, Serum Latest Ref Range: 0.0 - 4.0 ng/mL <0.1 <0.1 <0.1       Impression and Plan:  72 year old man with:  1.  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   He remains on Xtandi without any new complications.  His PSA continues to be undetectable without any major concerns.  Risks and benefits of continuing this therapy long-term was reviewed today.  Long-term complications including hypertension, seizures as well as hematuria were reiterated.  For the time being he is agreeable to continue.  Alternative therapy would be chemotherapy in the form of Taxotere.   2. Androgen deprivation: He continues to be on Lupron 30 mg every 4 months.  Long-term complication associated with androgen deprivation including weight gain, hot flashes and sexual dysfunction were reviewed.  He is agreeable to continue.  3. Bone directed therapy: I have recommended continuing calcium and vitamin D supplements.  Consideration for Delton See will be considered.  4. Follow-up: In 4 months.  15 minutes was spent with the patient face-to-face today.  More than 50% of time was dedicated to reviewing his disease status, treatment options and complications related to therapy.     Benjamin Button, MD 12/20/201910:13 AM

## 2018-12-28 ENCOUNTER — Other Ambulatory Visit: Payer: Self-pay | Admitting: Oncology

## 2018-12-28 DIAGNOSIS — C61 Malignant neoplasm of prostate: Secondary | ICD-10-CM

## 2019-01-01 MED FILL — XTANDI 40 MG CAPSULE: 40 | 30 days supply | Qty: 120 | Fill #0

## 2019-01-22 ENCOUNTER — Other Ambulatory Visit: Payer: Self-pay | Admitting: Oncology

## 2019-01-22 DIAGNOSIS — C61 Malignant neoplasm of prostate: Secondary | ICD-10-CM

## 2019-02-05 MED FILL — XTANDI 40 MG CAPSULE: 40 | 30 days supply | Qty: 120 | Fill #0

## 2019-02-26 ENCOUNTER — Other Ambulatory Visit: Payer: Self-pay | Admitting: Oncology

## 2019-02-26 DIAGNOSIS — C61 Malignant neoplasm of prostate: Secondary | ICD-10-CM

## 2019-03-05 MED FILL — XTANDI 40 MG CAPSULE: 40 | 30 days supply | Qty: 120 | Fill #0

## 2019-03-26 ENCOUNTER — Other Ambulatory Visit: Payer: Self-pay | Admitting: Oncology

## 2019-03-26 DIAGNOSIS — C61 Malignant neoplasm of prostate: Secondary | ICD-10-CM

## 2019-04-01 MED FILL — XTANDI 40 MG CAPSULE: 40 | 30 days supply | Qty: 120 | Fill #0

## 2019-04-09 ENCOUNTER — Other Ambulatory Visit: Payer: Self-pay

## 2019-04-09 ENCOUNTER — Inpatient Hospital Stay: Payer: Medicare Other | Attending: Oncology

## 2019-04-09 DIAGNOSIS — Z5111 Encounter for antineoplastic chemotherapy: Secondary | ICD-10-CM | POA: Insufficient documentation

## 2019-04-09 DIAGNOSIS — C7951 Secondary malignant neoplasm of bone: Secondary | ICD-10-CM | POA: Insufficient documentation

## 2019-04-09 DIAGNOSIS — C61 Malignant neoplasm of prostate: Secondary | ICD-10-CM | POA: Diagnosis not present

## 2019-04-09 LAB — CMP (CANCER CENTER ONLY)
ALT: 8 U/L (ref 0–44)
AST: 11 U/L — ABNORMAL LOW (ref 15–41)
Albumin: 3.4 g/dL — ABNORMAL LOW (ref 3.5–5.0)
Alkaline Phosphatase: 141 U/L — ABNORMAL HIGH (ref 38–126)
Anion gap: 8 (ref 5–15)
BUN: 16 mg/dL (ref 8–23)
CO2: 27 mmol/L (ref 22–32)
Calcium: 9.2 mg/dL (ref 8.9–10.3)
Chloride: 105 mmol/L (ref 98–111)
Creatinine: 0.95 mg/dL (ref 0.61–1.24)
GFR, Est AFR Am: >60 mL/min (ref >60)
GFR, Estimated: >60 mL/min (ref >60)
Glucose, Bld: 92 mg/dL (ref 70–99)
Potassium: 4.4 mmol/L (ref 3.5–5.1)
Sodium: 140 mmol/L (ref 135–145)
Total Bilirubin: 0.6 mg/dL (ref 0.3–1.2)
Total Protein: 7.2 g/dL (ref 6.5–8.1)

## 2019-04-09 LAB — CBC WITH DIFFERENTIAL (CANCER CENTER ONLY)
Abs Immature Granulocytes: 0 10*3/uL (ref 0.00–0.07)
Basophils Absolute: 0 10*3/uL (ref 0.0–0.1)
Basophils Relative: 1 %
Eosinophils Absolute: 0.1 10*3/uL (ref 0.0–0.5)
Eosinophils Relative: 3 %
HCT: 42.1 % (ref 39.0–52.0)
Hemoglobin: 13.4 g/dL (ref 13.0–17.0)
Immature Granulocytes: 0 %
Lymphocytes Relative: 54 %
Lymphs Abs: 2.2 10*3/uL (ref 0.7–4.0)
MCH: 29.8 pg (ref 26.0–34.0)
MCHC: 31.8 g/dL (ref 30.0–36.0)
MCV: 93.8 fL (ref 80.0–100.0)
Monocytes Absolute: 0.3 10*3/uL (ref 0.1–1.0)
Monocytes Relative: 8 %
Neutro Abs: 1.4 10*3/uL — ABNORMAL LOW (ref 1.7–7.7)
Neutrophils Relative %: 34 %
Platelet Count: 209 10*3/uL (ref 150–400)
RBC: 4.49 MIL/uL (ref 4.22–5.81)
RDW: 13.5 % (ref 11.5–15.5)
WBC Count: 4 10*3/uL (ref 4.0–10.5)
nRBC: 0 % (ref 0.0–0.2)

## 2019-04-10 LAB — PROSTATE-SPECIFIC AG, SERUM (LABCORP): Prostate Specific Ag, Serum: <0.1 ng/mL (ref 0.0–4.0)

## 2019-04-16 ENCOUNTER — Other Ambulatory Visit: Payer: Self-pay | Admitting: Pharmacist

## 2019-04-16 ENCOUNTER — Other Ambulatory Visit: Payer: Self-pay

## 2019-04-16 ENCOUNTER — Inpatient Hospital Stay: Payer: Medicare Other

## 2019-04-16 ENCOUNTER — Inpatient Hospital Stay (HOSPITAL_BASED_OUTPATIENT_CLINIC_OR_DEPARTMENT_OTHER): Payer: Medicare Other | Admitting: Oncology

## 2019-04-16 VITALS — BP 140/85 | HR 74 | Temp 98.0°F | Resp 17 | Ht 65.0 in | Wt 233.0 lb

## 2019-04-16 DIAGNOSIS — C61 Malignant neoplasm of prostate: Secondary | ICD-10-CM

## 2019-04-16 DIAGNOSIS — C7951 Secondary malignant neoplasm of bone: Secondary | ICD-10-CM | POA: Diagnosis not present

## 2019-04-16 DIAGNOSIS — E291 Testicular hypofunction: Secondary | ICD-10-CM | POA: Diagnosis not present

## 2019-04-16 DIAGNOSIS — Z5111 Encounter for antineoplastic chemotherapy: Secondary | ICD-10-CM | POA: Diagnosis not present

## 2019-04-16 MED ORDER — LEUPROLIDE ACETATE (4 MONTH) 30 MG IM KIT
30.0000 mg | PACK | Freq: Once | INTRAMUSCULAR | Status: AC
  Administered 2019-04-16: 30 mg via INTRAMUSCULAR
  Filled 2019-04-16: qty 30

## 2019-04-16 NOTE — Patient Instructions (Signed)

## 2019-04-16 NOTE — Progress Notes (Signed)
Hematology and Oncology Follow Up Visit  Benjamin Brown [id number] Jul 15, 1946 73 y.o. 04/16/2019 12:51 PM [name], MDHill, Berneta Sages, MD   Principle Diagnosis: 73 year old man with advanced prostate cancer with disease to the bone diagnosed in 2018.  He was found to have Gleason score 4+4 = 8 and a PSA of 71.9 in 2014.  Current therapy:  Lupron 30 mg injection every 4 months.   Xtandi 160 mg daily started on October 11, 2017.  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  Since the last visit, he reports no major changes in his health.  He continues to tolerate Xtandi without any recent changes or complications.  He denies any worsening bone pain or pathological fractures.  He denies any seizures or alteration mental status.  He did report some occasional dizziness and some fatigue.  Patient denied any alteration mental status, neuropathy, confusion or dizziness.  Denies any headaches or lethargy.  Denies any night sweats, weight loss or changes in appetite.  Denied orthopnea, dyspnea on exertion or chest discomfort.  Denies shortness of breath, difficulty breathing hemoptysis or cough.  Denies any abdominal distention, nausea, early satiety or dyspepsia.  Denies any hematuria, frequency, dysuria or nocturia.  Denies any skin irritation, dryness or rash.  Denies any ecchymosis or petechiae.  Denies any lymphadenopathy or clotting.  Denies any heat or cold intolerance.  Denies any anxiety or depression.  Remaining review of system is negative.     Medications: I have reviewed the patient's current medications.  Current Outpatient Medications  Medication Sig Dispense Refill  . XTANDI 40 MG capsule TAKE 4 CAPSULES (160 MG TOTAL) BY MOUTH DAILY. 120 capsule 0   No current facility-administered medications for this visit.      Allergies: No Known Allergies  Past Medical History, Surgical history, Social history, and Family History were reviewed and updated.   Physical  Exam:  Blood pressure 140/85, pulse 74, temperature 98 F (36.7 C), temperature source Oral, resp. rate 17, height 5\' 5"  (1.651 m), weight 233 lb (105.7 kg), SpO2 98 %.    ECOG 0    General appearance: Comfortable appearing without any discomfort Head: Normocephalic without any trauma Oropharynx: Mucous membranes are moist and pink without any thrush or ulcers. Eyes: Pupils are equal and round reactive to light. Lymph nodes: No cervical, supraclavicular, inguinal or axillary lymphadenopathy.   Abdomin: Soft, nontender, nondistended with good bowel sounds.  No hepatosplenomegaly. Musculoskeletal: No joint deformity or effusion.  Full range of motion noted. Neurological: No deficits noted on motor, sensory and deep tendon reflex exam. Skin: No petechial rash or dryness.  Appeared moist.        CBC    Component Value Date/Time   WBC 4.0 04/09/2019 1044   WBC 4.0 12/19/2017 1003   RBC 4.49 04/09/2019 1044   HGB 13.4 04/09/2019 1044   HGB 12.9 (L) 12/19/2017 1003   HCT 42.1 04/09/2019 1044   HCT 39.9 12/19/2017 1003   PLT 209 04/09/2019 1044   PLT 226 12/19/2017 1003   MCV 93.8 04/09/2019 1044   MCV 89.6 12/19/2017 1003   MCH 29.8 04/09/2019 1044   MCHC 31.8 04/09/2019 1044   RDW 13.5 04/09/2019 1044   RDW 14.6 12/19/2017 1003   LYMPHSABS 2.2 04/09/2019 1044   LYMPHSABS 1.9 12/19/2017 1003   MONOABS 0.3 04/09/2019 1044   MONOABS 0.3 12/19/2017 1003   EOSABS 0.1 04/09/2019 1044   EOSABS 0.1 12/19/2017 1003   BASOSABS 0.0 04/09/2019  1044   BASOSABS 0.0 12/19/2017 1003     Chemistry      Component Value Date/Time   NA 140 04/09/2019 1044   NA 142 12/19/2017 1003   K 4.4 04/09/2019 1044   K 4.6 12/19/2017 1003   CL 105 04/09/2019 1044   CO2 27 04/09/2019 1044   CO2 27 12/19/2017 1003   BUN 16 04/09/2019 1044   BUN 15.9 12/19/2017 1003   CREATININE 0.95 04/09/2019 1044   CREATININE 1.0 12/19/2017 1003      Component Value Date/Time   CALCIUM 9.2 04/09/2019  1044   CALCIUM 9.6 12/19/2017 1003   ALKPHOS 141 (H) 04/09/2019 1044   ALKPHOS 132 12/19/2017 1003   AST 11 (L) 04/09/2019 1044   AST 13 12/19/2017 1003   ALT 8 04/09/2019 1044   ALT 10 12/19/2017 1003   BILITOT 0.6 04/09/2019 1044   BILITOT 0.54 12/19/2017 1003      Results for XAVI, TOMASIK (MRN [id number]) as of 04/16/2019 12:51  Ref. Range 12/04/2018 10:29 04/09/2019 10:44  Prostate Specific Ag, Serum Latest Ref Range: 0.0 - 4.0 ng/mL <0.1 <0.1          Impression and Plan:  73 year old man with:  1.  Advanced prostate cancer with disease to the bone that is currently castration-resistant.    He continues to tolerate Xtandi without any major complications.  His PSA continues to be undetectable.  Risks and benefits of continuing this therapy was discussed today.  Long-term complications associated with this treatment include hypertension, edema among others.  After discussion today is agreeable to continue.  Alternative therapies may be needed if he develops advanced disease.  CT scan and bone scan will be done prior to next visit to complete his staging.   2. Androgen deprivation: He continues to be on Lupron 30 mg every 4 months.  Long-term complication associated with androgen deprivation including weight gain, hot flashes and sexual dysfunction were reviewed.  He is agreeable to continue.  3. Bone directed therapy: I recommended calcium and vitamin D supplements.  If he develops worsening bony disease Delton See would be an option in the future.  His alkaline phosphatase has been slightly elevated and we will repeat staging work-up before the next visit.  4. Follow-up: We will be in 4 months for repeat evaluation and Lupron injection.  25 minutes was spent with the patient face-to-face today.  More than 50% of time was spent on reviewing his disease status, treatment options, complications related therapy and answering questions regarding future plan of care.     Zola Button, MD 4/24/202012:51 PM

## 2019-04-19 ENCOUNTER — Telehealth: Payer: Self-pay | Admitting: Oncology

## 2019-04-19 NOTE — Telephone Encounter (Signed)
Scheduled appt per 4/24 sch message. ° °A calendar will be mailed out. °

## 2019-04-24 ENCOUNTER — Other Ambulatory Visit: Payer: Self-pay | Admitting: Oncology

## 2019-04-24 DIAGNOSIS — C61 Malignant neoplasm of prostate: Secondary | ICD-10-CM

## 2019-05-05 MED FILL — XTANDI 40 MG CAPSULE: 40 | 30 days supply | Qty: 120 | Fill #0

## 2019-05-27 ENCOUNTER — Other Ambulatory Visit: Payer: Self-pay | Admitting: Oncology

## 2019-05-27 DIAGNOSIS — C61 Malignant neoplasm of prostate: Secondary | ICD-10-CM

## 2019-06-03 MED FILL — XTANDI 40 MG CAPSULE: 40 | 30 days supply | Qty: 120 | Fill #0

## 2019-06-15 MED ORDER — PALONOSETRON HCL INJECTION 0.25 MG/5ML
INTRAVENOUS | Status: AC
  Filled 2019-06-15: qty 5

## 2019-06-15 MED ORDER — CYANOCOBALAMIN 1000 MCG/ML IJ SOLN
INTRAMUSCULAR | Status: AC
  Filled 2019-06-15: qty 1

## 2019-06-24 ENCOUNTER — Other Ambulatory Visit: Payer: Self-pay | Admitting: Oncology

## 2019-06-24 DIAGNOSIS — C61 Malignant neoplasm of prostate: Secondary | ICD-10-CM

## 2019-06-29 MED FILL — XTANDI 40 MG CAPSULE: 40 | 30 days supply | Qty: 120 | Fill #0

## 2019-07-23 ENCOUNTER — Other Ambulatory Visit: Payer: Self-pay | Admitting: Oncology

## 2019-07-23 DIAGNOSIS — C61 Malignant neoplasm of prostate: Secondary | ICD-10-CM

## 2019-07-28 ENCOUNTER — Telehealth: Payer: Self-pay

## 2019-07-28 NOTE — Telephone Encounter (Signed)
Received a message from central scheduler [facility] stating that the patient is requesting that his lab appt on 8/21 be changed from 8am to 10am, closer to his scan appts. Patient lab appointment changed from 8am to 10am on 8/21. Unable to leave message on pt phone listed and no VM, so left message with above information on spouse phone with return contact for any questions or concerns.

## 2019-08-03 MED FILL — XTANDI 40 MG CAPSULE: 40 | 30 days supply | Qty: 120 | Fill #0

## 2019-08-13 ENCOUNTER — Encounter (HOSPITAL_COMMUNITY)
Admission: RE | Admit: 2019-08-13 | Discharge: 2019-08-13 | Disposition: A | Discharge status: Home / Self Care | Payer: Medicare Other | Source: Ambulatory Visit | Attending: Oncology | Admitting: Oncology

## 2019-08-13 ENCOUNTER — Other Ambulatory Visit: Payer: Self-pay

## 2019-08-13 ENCOUNTER — Ambulatory Visit (HOSPITAL_COMMUNITY)
Admission: RE | Admit: 2019-08-13 | Discharge: 2019-08-13 | Disposition: A | Discharge status: Home / Self Care | Payer: Medicare Other | Source: Ambulatory Visit | Attending: Oncology | Admitting: Oncology

## 2019-08-13 ENCOUNTER — Other Ambulatory Visit: Payer: Medicare Other

## 2019-08-13 ENCOUNTER — Inpatient Hospital Stay: Payer: Medicare Other | Attending: Oncology

## 2019-08-13 ENCOUNTER — Encounter (HOSPITAL_COMMUNITY): Payer: Self-pay

## 2019-08-13 DIAGNOSIS — C61 Malignant neoplasm of prostate: Secondary | ICD-10-CM | POA: Insufficient documentation

## 2019-08-13 DIAGNOSIS — Z5111 Encounter for antineoplastic chemotherapy: Secondary | ICD-10-CM | POA: Diagnosis not present

## 2019-08-13 DIAGNOSIS — C7951 Secondary malignant neoplasm of bone: Secondary | ICD-10-CM | POA: Diagnosis not present

## 2019-08-13 LAB — CBC WITH DIFFERENTIAL (CANCER CENTER ONLY)
Abs Immature Granulocytes: 0.01 10*3/uL (ref 0.00–0.07)
Basophils Absolute: 0 10*3/uL (ref 0.0–0.1)
Basophils Relative: 0 %
Eosinophils Absolute: 0.1 10*3/uL (ref 0.0–0.5)
Eosinophils Relative: 2 %
HCT: 40.2 % (ref 39.0–52.0)
Hemoglobin: 13.1 g/dL (ref 13.0–17.0)
Immature Granulocytes: 0 %
Lymphocytes Relative: 54 %
Lymphs Abs: 2.1 10*3/uL (ref 0.7–4.0)
MCH: 29.9 pg (ref 26.0–34.0)
MCHC: 32.6 g/dL (ref 30.0–36.0)
MCV: 91.8 fL (ref 80.0–100.0)
Monocytes Absolute: 0.3 10*3/uL (ref 0.1–1.0)
Monocytes Relative: 8 %
Neutro Abs: 1.5 10*3/uL — ABNORMAL LOW (ref 1.7–7.7)
Neutrophils Relative %: 36 %
Platelet Count: 193 10*3/uL (ref 150–400)
RBC: 4.38 MIL/uL (ref 4.22–5.81)
RDW: 13.2 % (ref 11.5–15.5)
WBC Count: 4 10*3/uL (ref 4.0–10.5)
nRBC: 0 % (ref 0.0–0.2)

## 2019-08-13 LAB — CMP (CANCER CENTER ONLY)
ALT: 9 U/L (ref 0–44)
AST: 13 U/L — ABNORMAL LOW (ref 15–41)
Albumin: 3.5 g/dL (ref 3.5–5.0)
Alkaline Phosphatase: 134 U/L — ABNORMAL HIGH (ref 38–126)
Anion gap: 8 (ref 5–15)
BUN: 16 mg/dL (ref 8–23)
CO2: 24 mmol/L (ref 22–32)
Calcium: 9.3 mg/dL (ref 8.9–10.3)
Chloride: 106 mmol/L (ref 98–111)
Creatinine: 0.98 mg/dL (ref 0.61–1.24)
GFR, Est AFR Am: >60 mL/min (ref >60)
GFR, Estimated: >60 mL/min (ref >60)
Glucose, Bld: 100 mg/dL — ABNORMAL HIGH (ref 70–99)
Potassium: 4.8 mmol/L (ref 3.5–5.1)
Sodium: 138 mmol/L (ref 135–145)
Total Bilirubin: 0.8 mg/dL (ref 0.3–1.2)
Total Protein: 7.3 g/dL (ref 6.5–8.1)

## 2019-08-13 MED ORDER — IOHEXOL 300 MG/ML  SOLN
30.0000 mL | Freq: Once | INTRAMUSCULAR | Status: AC | PRN
  Administered 2019-08-13: 10:00:00 30 mL via ORAL

## 2019-08-13 MED ORDER — SODIUM CHLORIDE (PF) 0.9 % IJ SOLN
INTRAMUSCULAR | Status: AC
  Filled 2019-08-13: qty 50

## 2019-08-13 MED ORDER — IOHEXOL 300 MG/ML  SOLN
100.0000 mL | Freq: Once | INTRAMUSCULAR | Status: AC | PRN
  Administered 2019-08-13: 12:00:00 100 mL via INTRAVENOUS

## 2019-08-13 MED ORDER — TECHNETIUM TC 99M MEDRONATE IV KIT
20.0000 | PACK | Freq: Once | INTRAVENOUS | Status: AC | PRN
  Administered 2019-08-13: 11:00:00 20 via INTRAVENOUS

## 2019-08-14 LAB — PROSTATE-SPECIFIC AG, SERUM (LABCORP): Prostate Specific Ag, Serum: <0.1 ng/mL (ref 0.0–4.0)

## 2019-08-20 ENCOUNTER — Inpatient Hospital Stay (HOSPITAL_BASED_OUTPATIENT_CLINIC_OR_DEPARTMENT_OTHER): Payer: Medicare Other | Admitting: Oncology

## 2019-08-20 ENCOUNTER — Other Ambulatory Visit: Payer: Self-pay

## 2019-08-20 ENCOUNTER — Inpatient Hospital Stay: Payer: Medicare Other

## 2019-08-20 VITALS — BP 140/80 | HR 64 | Temp 98.2°F | Resp 17 | Ht 65.0 in | Wt 234.6 lb

## 2019-08-20 DIAGNOSIS — C7951 Secondary malignant neoplasm of bone: Secondary | ICD-10-CM | POA: Diagnosis not present

## 2019-08-20 DIAGNOSIS — M810 Age-related osteoporosis without current pathological fracture: Secondary | ICD-10-CM

## 2019-08-20 DIAGNOSIS — C61 Malignant neoplasm of prostate: Secondary | ICD-10-CM | POA: Diagnosis not present

## 2019-08-20 DIAGNOSIS — Z5111 Encounter for antineoplastic chemotherapy: Secondary | ICD-10-CM | POA: Diagnosis not present

## 2019-08-20 MED ORDER — LEUPROLIDE ACETATE (4 MONTH) 30 MG IM KIT
30.0000 mg | PACK | Freq: Once | INTRAMUSCULAR | Status: AC
  Administered 2019-08-20: 30 mg via INTRAMUSCULAR
  Filled 2019-08-20: qty 30

## 2019-08-20 NOTE — Patient Instructions (Signed)
Leuprolide injection What is this medicine? LEUPROLIDE (loo PROE lide) is a man-made hormone. It is used to treat the symptoms of prostate cancer. This medicine may also be used to treat children with early onset of puberty. It may be used for other hormonal conditions. This medicine may be used for other purposes; ask your health care provider or pharmacist if you have questions. COMMON BRAND NAME(S): Lupron What should I tell my health care provider before I take this medicine? They need to know if you have any of these conditions:  diabetes  heart disease or previous heart attack  high blood pressure  high cholesterol  pain or difficulty passing urine  spinal cord metastasis  stroke  tobacco smoker  an unusual or allergic reaction to leuprolide, benzyl alcohol, other medicines, foods, dyes, or preservatives  pregnant or trying to get pregnant  breast-feeding How should I use this medicine? This medicine is for injection under the skin or into a muscle. You will be taught how to prepare and give this medicine. Use exactly as directed. Take your medicine at regular intervals. Do not take your medicine more often than directed. It is important that you put your used needles and syringes in a special sharps container. Do not put them in a trash can. If you do not have a sharps container, call your pharmacist or healthcare provider to get one. A special MedGuide will be given to you by the pharmacist with each prescription and refill. Be sure to read this information carefully each time. Talk to your pediatrician regarding the use of this medicine in children. While this medicine may be prescribed for children as young as 8 years for selected conditions, precautions do apply. Overdosage: If you think you have taken too much of this medicine contact a poison control center or emergency room at once. NOTE: This medicine is only for you. Do not share this medicine with others. What if  I miss a dose? If you miss a dose, take it as soon as you can. If it is almost time for your next dose, take only that dose. Do not take double or extra doses. What may interact with this medicine? Do not take this medicine with any of the following medications:  chasteberry This medicine may also interact with the following medications:  herbal or dietary supplements, like black cohosh or DHEA  male hormones, like estrogens or progestins and birth control pills, patches, rings, or injections  male hormones, like testosterone This list may not describe all possible interactions. Give your health care provider a list of all the medicines, herbs, non-prescription drugs, or dietary supplements you use. Also tell them if you smoke, drink alcohol, or use illegal drugs. Some items may interact with your medicine. What should I watch for while using this medicine? Visit your doctor or health care professional for regular checks on your progress. During the first week, your symptoms may get worse, but then will improve as you continue your treatment. You may get hot flashes, increased bone pain, increased difficulty passing urine, or an aggravation of nerve symptoms. Discuss these effects with your doctor or health care professional, some of them may improve with continued use of this medicine. Male patients may experience a menstrual cycle or spotting during the first 2 months of therapy with this medicine. If this continues, contact your doctor or health care professional. This medicine may increase blood sugar. Ask your healthcare provider if changes in diet or medicines are needed if   you have diabetes. What side effects may I notice from receiving this medicine? Side effects that you should report to your doctor or health care professional as soon as possible:  allergic reactions like skin rash, itching or hives, swelling of the face, lips, or tongue  breathing problems  chest  pain  depression or memory disorders  pain in your legs or groin  pain at site where injected  severe headache  signs and symptoms of high blood sugar such as being more thirsty or hungry or having to urinate more than normal. You may also feel very tired or have blurry vision  swelling of the feet and legs  visual changes  vomiting Side effects that usually do not require medical attention (report to your doctor or health care professional if they continue or are bothersome):  breast swelling or tenderness  decrease in sex drive or performance  diarrhea  hot flashes  loss of appetite  muscle, joint, or bone pains  nausea  redness or irritation at site where injected  skin problems or acne This list may not describe all possible side effects. Call your doctor for medical advice about side effects. You may report side effects to FDA at 1-800-FDA-1088. Where should I keep my medicine? Keep out of the reach of children. Store below 25 degrees C (77 degrees F). Do not freeze. Protect from light. Do not use if it is not clear or if there are particles present. Throw away any unused medicine after the expiration date. NOTE: This sheet is a summary. It may not cover all possible information. If you have questions about this medicine, talk to your doctor, pharmacist, or health care provider.  2020 [employer]vier/Gold Standard (2018-10-08 09:52:48)  

## 2019-08-20 NOTE — Progress Notes (Signed)
Hematology and Oncology Follow Up Visit  ANGELL GAINOUS [id number] 1946/01/03 72 y.o. 08/20/2019 1:08 PM [name], MDHill, Berneta Sages, MD   Principle Diagnosis: 73 year old man with castration-resistant prostate cancer with disease to the bone diagnosed in 2018.  He presented in 2014 with Gleason score 4+4 = 8 and a PSA of 71.9 localized cancer.  Current therapy:  Lupron 30 mg injection every 4 months.   Xtandi 160 mg daily started on October 11, 2017.  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  Since the last visit, he reports no major changes in his health.  He does report some fatigue and some tiredness but occasional dizziness associated with postural changes.  He denies any nausea, vomiting or weight loss.  He denies any worsening bone pain.  He denies any recent hospitalizations or illnesses.   He brings denied headaches, blurry vision, syncope or seizures.  Denies any fevers, chills or sweats.  Denied chest pain, palpitation, orthopnea or leg edema.  Denied cough, wheezing or hemoptysis.  Denied nausea, vomiting or abdominal pain.  Denies any constipation or diarrhea.  Denies any frequency urgency or hesitancy.  Denies any arthralgias or myalgias.  Denies any skin rashes or lesions.  Denies any bleeding or clotting tendency.  Denies any easy bruising.  Denies any hair or nail changes.  Denies any anxiety or depression.  Remaining review of system is negative.    Medications: Updated on reviewed today.   Current Outpatient Medications  Medication Sig Dispense Refill  . XTANDI 40 MG capsule TAKE 4 CAPSULES (160 MG TOTAL) BY MOUTH DAILY. 120 capsule 0   No current facility-administered medications for this visit.      Allergies: No Known Allergies  Past Medical History, Surgical history, Social history, and Family History without any changes on review.   Physical Exam:  Blood pressure 140/80, pulse 64, temperature 98.2 F (36.8 C), temperature source  Oral, resp. rate 17, height 5\' 5"  (1.651 m), weight 234 lb 9.6 oz (106.4 kg), SpO2 99 %.    ECOG 0    General appearance: Alert, awake without any distress. Head: Atraumatic without abnormalities Oropharynx: Without any thrush or ulcers. Eyes: No scleral icterus. Lymph nodes: No lymphadenopathy noted in the cervical, supraclavicular, or axillary nodes Abdomin: Soft, nontender without any shifting dullness or ascites. Musculoskeletal: No clubbing or cyanosis. Neurological: No motor or sensory deficits. Skin: No rashes or lesions. Psychiatric: Mood and affect appeared normal.        CBC    Component Value Date/Time   WBC 4.0 08/13/2019 0947   WBC 4.0 12/19/2017 1003   RBC 4.38 08/13/2019 0947   HGB 13.1 08/13/2019 0947   HGB 12.9 (L) 12/19/2017 1003   HCT 40.2 08/13/2019 0947   HCT 39.9 12/19/2017 1003   PLT 193 08/13/2019 0947   PLT 226 12/19/2017 1003   MCV 91.8 08/13/2019 0947   MCV 89.6 12/19/2017 1003   MCH 29.9 08/13/2019 0947   MCHC 32.6 08/13/2019 0947   RDW 13.2 08/13/2019 0947   RDW 14.6 12/19/2017 1003   LYMPHSABS 2.1 08/13/2019 0947   LYMPHSABS 1.9 12/19/2017 1003   MONOABS 0.3 08/13/2019 0947   MONOABS 0.3 12/19/2017 1003   EOSABS 0.1 08/13/2019 0947   EOSABS 0.1 12/19/2017 1003   BASOSABS 0.0 08/13/2019 0947   BASOSABS 0.0 12/19/2017 1003     Chemistry      Component Value Date/Time   NA 138 08/13/2019 0947   NA 142 12/19/2017 1003  K 4.8 08/13/2019 0947   K 4.6 12/19/2017 1003   CL 106 08/13/2019 0947   CO2 24 08/13/2019 0947   CO2 27 12/19/2017 1003   BUN 16 08/13/2019 0947   BUN 15.9 12/19/2017 1003   CREATININE 0.98 08/13/2019 0947   CREATININE 1.0 12/19/2017 1003      Component Value Date/Time   CALCIUM 9.3 08/13/2019 0947   CALCIUM 9.6 12/19/2017 1003   ALKPHOS 134 (H) 08/13/2019 0947   ALKPHOS 132 12/19/2017 1003   AST 13 (L) 08/13/2019 0947   AST 13 12/19/2017 1003   ALT 9 08/13/2019 0947   ALT 10 12/19/2017 1003    BILITOT 0.8 08/13/2019 0947   BILITOT 0.54 12/19/2017 1003        Results for IZIK, CHHOEUN (MRN [id number]) as of 08/20/2019 12:58  Ref. Range 04/09/2019 10:44 08/13/2019 09:47  Prostate Specific Ag, Serum Latest Ref Range: 0.0 - 4.0 ng/mL <0.1 <0.1    IMPRESSION: Similar-appearing patchy lucent lesion within the L4 vertebral body. Additionally, similar-appearing patchy lucencies within the ileum bilaterally, overall compatible with history of a osseous metastasis.  No new or worsening findings within the abdomen or pelvis.  IMPRESSION: Chronic increased uptake at the L4 vertebral body unchanged since 2018.  This corresponds to a mixed lytic and sclerotic process on CT, which is unchanged since an earlier CT from 2018 and extends into the pedicles and spinous process.  The CT appearance is atypical for metastatic prostate cancer.  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  No scintigraphic evidence of osseous metastatic disease is identified.  The lytic lesions identified within the iliac bones on CT exam are of uncertain etiology, atypical for prostate cancer metastases due the lytic nature and the absence of uptake on scintigraphy; these do not appear sclerotic like typical treated metastases.  Other etiologies including myeloma can cause lytic lesions on CT and be negative by bone scintigraphy, but these are unchanged since 2018 and the etiology remains uncertain.    Impression and Plan:  73 year old man with:  1.  Castration-resistant prostate cancer with disease to the bone documented in 2018.     He remains on Xtandi with excellent PSA response is currently undetectable.  Imaging studies including CT scan and a bone scan obtained on 08/13/2019 were  personally reviewed and showed no evidence of measurable disease at this time.  Risks and benefits of continuing this therapy was discussed today.  Long-term complications also reviewed including osteoporosis, hypertension and fatigue.  He is agreeable to continue.   2. Androgen deprivation: Long-term complication associated with androgen deprivation was discussed.  These include hot flashes, weight gain and osteoporosis.  He will receive Lupron today and repeated in 4 months.  3. Bone directed therapy: Given his long-term androgen deprivation will be a risk of developing osteoporosis and would benefit from improvement in his bone density.  We will obtain a DEXA scan for the next visit.  In the meantime I recommended calcium and vitamin D supplements.  4. Follow-up: In 4 months for repeat evaluation and next Lupron.  25 minutes was spent with the patient face-to-face today.  More than 50% of time was spent on reviewing his disease status, reviewing imaging studies, treatment options and long-term complications of therapy.     Zola Button, MD 8/28/20201:08 PM

## 2019-08-23 ENCOUNTER — Telehealth: Payer: Self-pay | Admitting: Oncology

## 2019-08-23 NOTE — Telephone Encounter (Signed)
Called. Not able to leave msg. Mailed printout  °

## 2019-08-31 ENCOUNTER — Other Ambulatory Visit: Payer: Self-pay | Admitting: Oncology

## 2019-08-31 DIAGNOSIS — C61 Malignant neoplasm of prostate: Secondary | ICD-10-CM

## 2019-09-03 MED FILL — XTANDI 40 MG CAPSULE: 40 | 30 days supply | Qty: 120 | Fill #0

## 2019-09-27 ENCOUNTER — Other Ambulatory Visit: Payer: Self-pay | Admitting: Oncology

## 2019-09-27 DIAGNOSIS — C61 Malignant neoplasm of prostate: Secondary | ICD-10-CM

## 2019-09-30 MED FILL — XTANDI 40 MG CAPSULE: 40 | 30 days supply | Qty: 120 | Fill #0

## 2019-10-05 DIAGNOSIS — Z23 Encounter for immunization: Secondary | ICD-10-CM | POA: Diagnosis not present

## 2019-10-26 ENCOUNTER — Other Ambulatory Visit: Payer: Self-pay | Admitting: Oncology

## 2019-10-26 DIAGNOSIS — C61 Malignant neoplasm of prostate: Secondary | ICD-10-CM

## 2019-11-01 MED FILL — XTANDI 40 MG CAPSULE: 40 | 30 days supply | Qty: 120 | Fill #0

## 2019-11-17 ENCOUNTER — Other Ambulatory Visit: Payer: Self-pay

## 2019-11-25 ENCOUNTER — Other Ambulatory Visit: Payer: Self-pay | Admitting: Oncology

## 2019-11-25 DIAGNOSIS — C61 Malignant neoplasm of prostate: Secondary | ICD-10-CM

## 2019-11-29 MED FILL — XTANDI 40 MG CAPSULE: 40 | 30 days supply | Qty: 120 | Fill #0

## 2019-12-03 ENCOUNTER — Inpatient Hospital Stay: Payer: Medicare Other | Attending: Oncology

## 2019-12-03 ENCOUNTER — Other Ambulatory Visit: Payer: Self-pay

## 2019-12-03 DIAGNOSIS — Z5111 Encounter for antineoplastic chemotherapy: Secondary | ICD-10-CM | POA: Diagnosis not present

## 2019-12-03 DIAGNOSIS — C7951 Secondary malignant neoplasm of bone: Secondary | ICD-10-CM | POA: Insufficient documentation

## 2019-12-03 DIAGNOSIS — C61 Malignant neoplasm of prostate: Secondary | ICD-10-CM | POA: Diagnosis present

## 2019-12-03 LAB — CBC WITH DIFFERENTIAL (CANCER CENTER ONLY)
Abs Immature Granulocytes: 0.01 10*3/uL (ref 0.00–0.07)
Basophils Absolute: 0 10*3/uL (ref 0.0–0.1)
Basophils Relative: 1 %
Eosinophils Absolute: 0.1 10*3/uL (ref 0.0–0.5)
Eosinophils Relative: 2 %
HCT: 40.5 % (ref 39.0–52.0)
Hemoglobin: 13.1 g/dL (ref 13.0–17.0)
Immature Granulocytes: 0 %
Lymphocytes Relative: 51 %
Lymphs Abs: 2.3 10*3/uL (ref 0.7–4.0)
MCH: 29.4 pg (ref 26.0–34.0)
MCHC: 32.3 g/dL (ref 30.0–36.0)
MCV: 90.8 fL (ref 80.0–100.0)
Monocytes Absolute: 0.3 10*3/uL (ref 0.1–1.0)
Monocytes Relative: 8 %
Neutro Abs: 1.7 10*3/uL (ref 1.7–7.7)
Neutrophils Relative %: 38 %
Platelet Count: 205 10*3/uL (ref 150–400)
RBC: 4.46 MIL/uL (ref 4.22–5.81)
RDW: 13.4 % (ref 11.5–15.5)
WBC Count: 4.4 10*3/uL (ref 4.0–10.5)
nRBC: 0 % (ref 0.0–0.2)

## 2019-12-03 LAB — CMP (CANCER CENTER ONLY)
ALT: 8 U/L (ref 0–44)
AST: 12 U/L — ABNORMAL LOW (ref 15–41)
Albumin: 3.6 g/dL (ref 3.5–5.0)
Alkaline Phosphatase: 146 U/L — ABNORMAL HIGH (ref 38–126)
Anion gap: 8 (ref 5–15)
BUN: 12 mg/dL (ref 8–23)
CO2: 28 mmol/L (ref 22–32)
Calcium: 9.3 mg/dL (ref 8.9–10.3)
Chloride: 106 mmol/L (ref 98–111)
Creatinine: 0.95 mg/dL (ref 0.61–1.24)
GFR, Est AFR Am: >60 mL/min (ref >60)
GFR, Estimated: >60 mL/min (ref >60)
Glucose, Bld: 98 mg/dL (ref 70–99)
Potassium: 4.6 mmol/L (ref 3.5–5.1)
Sodium: 142 mmol/L (ref 135–145)
Total Bilirubin: 0.7 mg/dL (ref 0.3–1.2)
Total Protein: 7.5 g/dL (ref 6.5–8.1)

## 2019-12-04 LAB — PROSTATE-SPECIFIC AG, SERUM (LABCORP): Prostate Specific Ag, Serum: <0.1 ng/mL (ref 0.0–4.0)

## 2019-12-10 ENCOUNTER — Other Ambulatory Visit: Payer: Self-pay

## 2019-12-10 ENCOUNTER — Inpatient Hospital Stay (HOSPITAL_BASED_OUTPATIENT_CLINIC_OR_DEPARTMENT_OTHER): Payer: Medicare Other | Admitting: Oncology

## 2019-12-10 ENCOUNTER — Telehealth: Payer: Self-pay | Admitting: Oncology

## 2019-12-10 VITALS — BP 151/92 | HR 83 | Temp 98.2°F | Resp 17 | Wt 234.0 lb

## 2019-12-10 DIAGNOSIS — Z5111 Encounter for antineoplastic chemotherapy: Secondary | ICD-10-CM | POA: Diagnosis not present

## 2019-12-10 DIAGNOSIS — C61 Malignant neoplasm of prostate: Secondary | ICD-10-CM

## 2019-12-10 MED ORDER — LEUPROLIDE ACETATE (4 MONTH) 30 MG ~~LOC~~ KIT
30.0000 mg | PACK | Freq: Once | SUBCUTANEOUS | Status: AC
  Administered 2019-12-10: 09:00:00 30 mg via SUBCUTANEOUS
  Filled 2019-12-10: qty 30

## 2019-12-10 MED ORDER — LEUPROLIDE ACETATE (6 MONTH) 45 MG ~~LOC~~ KIT: 30.0000 mg | PACK | Freq: Once | SUBCUTANEOUS | Status: DC

## 2019-12-10 NOTE — Telephone Encounter (Signed)
Scheduled appt per 12/18 los.  Spoke with pt and per his request I mailed an appt calendar.

## 2019-12-10 NOTE — Progress Notes (Signed)
Hematology and Oncology Follow Up Visit  DERRICO DACRUZ [id number] April 16, 1946 73 y.o. 12/10/2019 8:09 AM [name], MDHill, Berneta Sages, MD   Principle Diagnosis: 73 year old man with advanced prostate cancer with disease to the bone noted in 2018.  He has castration-resistant disease after initial diagnosis and 2014 with Gleason score 4+4 = 8 and a PSA of 71.9.   Current therapy:  Lupron 30 mg injection every 4 months.   Xtandi 160 mg daily started on October 11, 2017.  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  Since the last visit, he reports no major changes in his health.  He reports some mild fatigue and tiredness but denies any recent hospitalization or illnesses.  He continues to tolerate Xtandi without any new complaints.  He denies any excessive fatigue, tiredness.  Denies any bone pain or pathological fractures.  He still attends activities of daily living without any complaints.  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   He denied any alteration mental status, neuropathy, confusion or dizziness.  Denies any headaches or lethargy.  Denies any night sweats, weight loss or changes in appetite.  Denied orthopnea, dyspnea on exertion or chest discomfort.  Denies shortness of breath, difficulty breathing hemoptysis or cough.  Denies any abdominal distention, nausea, early satiety or dyspepsia.  Denies any hematuria, frequency, dysuria or nocturia.  Denies any skin irritation, dryness or rash.  Denies any ecchymosis or petechiae.  Denies any lymphadenopathy or clotting.  Denies any heat or cold intolerance.  Denies any anxiety or depression.  Remaining review of system is negative.         Medications: Reviewed without any changes. Current Outpatient Medications  Medication Sig Dispense Refill  . XTANDI 40 MG capsule TAKE 4 CAPSULES (160 MG TOTAL) BY MOUTH DAILY. 120 capsule 0   No current facility-administered medications for this visit.      Allergies: No Known Allergies  Past Medical History, Surgical history, Social history, and Family History updated on review.   Physical Exam:  Blood pressure (!) 151/92, pulse 83, temperature 98.2 F (36.8 C), temperature source Tympanic, resp. rate 17, weight 234 lb (106.1 kg), SpO2 98 %.     ECOG 0    General appearance: Comfortable appearing without any discomfort Head: Normocephalic without any trauma Oropharynx: Mucous membranes are moist and pink without any thrush or ulcers. Eyes: Pupils are equal and round reactive to light. Lymph nodes: No cervical, supraclavicular, inguinal or axillary lymphadenopathy.   Heart:regular rate and rhythm.  S1 and S2 without leg edema. Lung: Clear without any rhonchi or wheezes.  No dullness to percussion. Abdomin: Soft, nontender, nondistended with good bowel sounds.  No hepatosplenomegaly. Musculoskeletal: No joint deformity or effusion.  Full range of motion noted. Neurological: No deficits noted on motor, sensory and deep tendon reflex exam. Skin: No petechial rash or dryness.  Appeared moist.          CBC    Component Value Date/Time   WBC 4.4 12/03/2019 0828   WBC 4.0 12/19/2017 1003   RBC 4.46 12/03/2019 0828   HGB 13.1 12/03/2019 0828   HGB 12.9 (L) 12/19/2017 1003   HCT 40.5 12/03/2019 0828   HCT 39.9 12/19/2017 1003   PLT 205 12/03/2019 0828   PLT 226 12/19/2017 1003   MCV 90.8 12/03/2019 0828   MCV 89.6 12/19/2017 1003   MCH 29.4 12/03/2019 0828   MCHC 32.3 12/03/2019 0828   RDW 13.4 12/03/2019 0828  RDW 14.6 12/19/2017 1003   LYMPHSABS 2.3 12/03/2019 0828   LYMPHSABS 1.9 12/19/2017 1003   MONOABS 0.3 12/03/2019 0828   MONOABS 0.3 12/19/2017 1003   EOSABS 0.1 12/03/2019 0828   EOSABS 0.1 12/19/2017 1003   BASOSABS 0.0 12/03/2019 0828   BASOSABS 0.0 12/19/2017 1003     Chemistry      Component Value Date/Time   NA 142 12/03/2019 0828   NA 142 12/19/2017 1003   K 4.6 12/03/2019 0828   K 4.6  12/19/2017 1003   CL 106 12/03/2019 0828   CO2 28 12/03/2019 0828   CO2 27 12/19/2017 1003   BUN 12 12/03/2019 0828   BUN 15.9 12/19/2017 1003   CREATININE 0.95 12/03/2019 0828   CREATININE 1.0 12/19/2017 1003      Component Value Date/Time   CALCIUM 9.3 12/03/2019 0828   CALCIUM 9.6 12/19/2017 1003   ALKPHOS 146 (H) 12/03/2019 0828   ALKPHOS 132 12/19/2017 1003   AST 12 (L) 12/03/2019 0828   AST 13 12/19/2017 1003   ALT 8 12/03/2019 0828   ALT 10 12/19/2017 1003   BILITOT 0.7 12/03/2019 0828   BILITOT 0.54 12/19/2017 1003      Results for MATAI, SAUVAGEAU (MRN [id number]) as of 12/10/2019 08:11  Ref. Range 08/13/2019 09:47 12/03/2019 08:28  Prostate Specific Ag, Serum Latest Ref Range: 0.0 - 4.0 ng/mL <0.1 <0.1     Impression and Plan:  73 year old man with:  1.  Advanced prostate cancer with disease to the bone diagnosed in 2018.  He has castration-resistant disease at this time.  He has tolerated Xtandi without any major complications with excellent PSA response.  Imaging studies obtained in August 2020 showed overall no disease progression.  Risks and benefits of continuing this therapy long-term was reviewed.  Potential complications including hypertension, edema, fatigue and rarely seizures were reiterated.  He is agreeable to continue at this time given his excellent response.   2. Androgen deprivation: He will receive Eligard today and repeated in 4 months.  Long-term complications including osteoporosis, hot flashes and sexual dysfunction were reiterated.  3. Bone directed therapy: Recommended calcium and vitamin D supplements and measurement of a bone density in the future to assess the need for bone directed therapy.  4. Follow-up: He will return in 4 months for repeat evaluation.  25 minutes was spent with the patient face-to-face today.  More than 50% of time was dedicated to reviewing laboratory data, disease status update and complications related to  therapy.     Zola Button, MD 12/18/20208:09 AM

## 2019-12-10 NOTE — Patient Instructions (Signed)

## 2019-12-21 ENCOUNTER — Other Ambulatory Visit: Payer: Self-pay | Admitting: Oncology

## 2019-12-21 DIAGNOSIS — C61 Malignant neoplasm of prostate: Secondary | ICD-10-CM

## 2019-12-30 MED FILL — XTANDI 40 MG CAPSULE: 40 | 30 days supply | Qty: 120 | Fill #0

## 2020-01-24 ENCOUNTER — Other Ambulatory Visit: Payer: Self-pay | Admitting: Oncology

## 2020-01-24 DIAGNOSIS — C61 Malignant neoplasm of prostate: Secondary | ICD-10-CM

## 2020-01-27 MED FILL — XTANDI 40 MG CAPSULE: 40 | 30 days supply | Qty: 120 | Fill #0

## 2020-02-23 ENCOUNTER — Other Ambulatory Visit: Payer: Self-pay | Admitting: Oncology

## 2020-02-23 DIAGNOSIS — C61 Malignant neoplasm of prostate: Secondary | ICD-10-CM

## 2020-02-29 MED FILL — XTANDI 40 MG CAPSULE: 40 | 30 days supply | Qty: 120 | Fill #0

## 2020-03-23 ENCOUNTER — Other Ambulatory Visit: Payer: Self-pay | Admitting: Oncology

## 2020-03-23 DIAGNOSIS — C61 Malignant neoplasm of prostate: Secondary | ICD-10-CM

## 2020-03-28 MED FILL — XTANDI 40 MG CAPSULE: 40 | 30 days supply | Qty: 120 | Fill #0

## 2020-04-07 ENCOUNTER — Inpatient Hospital Stay: Payer: Medicare Other | Attending: Oncology

## 2020-04-07 ENCOUNTER — Other Ambulatory Visit: Payer: Self-pay

## 2020-04-07 DIAGNOSIS — Z5111 Encounter for antineoplastic chemotherapy: Secondary | ICD-10-CM | POA: Diagnosis not present

## 2020-04-07 DIAGNOSIS — C7951 Secondary malignant neoplasm of bone: Secondary | ICD-10-CM | POA: Diagnosis not present

## 2020-04-07 DIAGNOSIS — C61 Malignant neoplasm of prostate: Secondary | ICD-10-CM

## 2020-04-07 LAB — CBC WITH DIFFERENTIAL (CANCER CENTER ONLY)
Abs Immature Granulocytes: 0.01 10*3/uL (ref 0.00–0.07)
Basophils Absolute: 0 10*3/uL (ref 0.0–0.1)
Basophils Relative: 0 %
Eosinophils Absolute: 0.1 10*3/uL (ref 0.0–0.5)
Eosinophils Relative: 2 %
HCT: 42.5 % (ref 39.0–52.0)
Hemoglobin: 14 g/dL (ref 13.0–17.0)
Immature Granulocytes: 0 %
Lymphocytes Relative: 54 %
Lymphs Abs: 2.5 10*3/uL (ref 0.7–4.0)
MCH: 30.2 pg (ref 26.0–34.0)
MCHC: 32.9 g/dL (ref 30.0–36.0)
MCV: 91.8 fL (ref 80.0–100.0)
Monocytes Absolute: 0.4 10*3/uL (ref 0.1–1.0)
Monocytes Relative: 7 %
Neutro Abs: 1.7 10*3/uL (ref 1.7–7.7)
Neutrophils Relative %: 37 %
Platelet Count: 217 10*3/uL (ref 150–400)
RBC: 4.63 MIL/uL (ref 4.22–5.81)
RDW: 13.4 % (ref 11.5–15.5)
WBC Count: 4.7 10*3/uL (ref 4.0–10.5)
nRBC: 0 % (ref 0.0–0.2)

## 2020-04-07 LAB — CMP (CANCER CENTER ONLY)
ALT: 8 U/L (ref 0–44)
AST: 13 U/L — ABNORMAL LOW (ref 15–41)
Albumin: 3.5 g/dL (ref 3.5–5.0)
Alkaline Phosphatase: 145 U/L — ABNORMAL HIGH (ref 38–126)
Anion gap: 12 (ref 5–15)
BUN: 19 mg/dL (ref 8–23)
CO2: 23 mmol/L (ref 22–32)
Calcium: 9.2 mg/dL (ref 8.9–10.3)
Chloride: 106 mmol/L (ref 98–111)
Creatinine: 0.95 mg/dL (ref 0.61–1.24)
GFR, Est AFR Am: >60 mL/min (ref >60)
GFR, Estimated: >60 mL/min (ref >60)
Glucose, Bld: 100 mg/dL — ABNORMAL HIGH (ref 70–99)
Potassium: 4.1 mmol/L (ref 3.5–5.1)
Sodium: 141 mmol/L (ref 135–145)
Total Bilirubin: 0.9 mg/dL (ref 0.3–1.2)
Total Protein: 7.7 g/dL (ref 6.5–8.1)

## 2020-04-08 LAB — PROSTATE-SPECIFIC AG, SERUM (LABCORP): Prostate Specific Ag, Serum: <0.1 ng/mL (ref 0.0–4.0)

## 2020-04-14 ENCOUNTER — Inpatient Hospital Stay (HOSPITAL_BASED_OUTPATIENT_CLINIC_OR_DEPARTMENT_OTHER): Payer: Medicare Other | Admitting: Oncology

## 2020-04-14 ENCOUNTER — Inpatient Hospital Stay: Payer: Medicare Other

## 2020-04-14 ENCOUNTER — Other Ambulatory Visit: Payer: Self-pay

## 2020-04-14 VITALS — BP 145/78 | HR 80 | Temp 98.2°F | Resp 18 | Wt 234.0 lb

## 2020-04-14 DIAGNOSIS — Z5111 Encounter for antineoplastic chemotherapy: Secondary | ICD-10-CM | POA: Diagnosis not present

## 2020-04-14 DIAGNOSIS — C7951 Secondary malignant neoplasm of bone: Secondary | ICD-10-CM | POA: Diagnosis not present

## 2020-04-14 DIAGNOSIS — C61 Malignant neoplasm of prostate: Secondary | ICD-10-CM

## 2020-04-14 MED ORDER — LEUPROLIDE ACETATE (4 MONTH) 30 MG ~~LOC~~ KIT
30.0000 mg | PACK | Freq: Once | SUBCUTANEOUS | Status: AC
  Administered 2020-04-14: 10:00:00 30 mg via SUBCUTANEOUS
  Filled 2020-04-14: qty 30

## 2020-04-14 NOTE — Patient Instructions (Signed)
Leuprolide injection What is this medicine? LEUPROLIDE (loo PROE lide) is a man-made hormone. It is used to treat the symptoms of prostate cancer. This medicine may also be used to treat children with early onset of puberty. It may be used for other hormonal conditions. This medicine may be used for other purposes; ask your health care provider or pharmacist if you have questions. COMMON BRAND NAME(S): Lupron What should I tell my health care provider before I take this medicine? They need to know if you have any of these conditions:  diabetes  heart disease or previous heart attack  high blood pressure  high cholesterol  pain or difficulty passing urine  spinal cord metastasis  stroke  tobacco smoker  an unusual or allergic reaction to leuprolide, benzyl alcohol, other medicines, foods, dyes, or preservatives  pregnant or trying to get pregnant  breast-feeding How should I use this medicine? This medicine is for injection under the skin or into a muscle. You will be taught how to prepare and give this medicine. Use exactly as directed. Take your medicine at regular intervals. Do not take your medicine more often than directed. It is important that you put your used needles and syringes in a special sharps container. Do not put them in a trash can. If you do not have a sharps container, call your pharmacist or healthcare provider to get one. A special MedGuide will be given to you by the pharmacist with each prescription and refill. Be sure to read this information carefully each time. Talk to your pediatrician regarding the use of this medicine in children. While this medicine may be prescribed for children as young as 8 years for selected conditions, precautions do apply. Overdosage: If you think you have taken too much of this medicine contact a poison control center or emergency room at once. NOTE: This medicine is only for you. Do not share this medicine with others. What if  I miss a dose? If you miss a dose, take it as soon as you can. If it is almost time for your next dose, take only that dose. Do not take double or extra doses. What may interact with this medicine? Do not take this medicine with any of the following medications:  chasteberry This medicine may also interact with the following medications:  herbal or dietary supplements, like black cohosh or DHEA  male hormones, like estrogens or progestins and birth control pills, patches, rings, or injections  male hormones, like testosterone This list may not describe all possible interactions. Give your health care provider a list of all the medicines, herbs, non-prescription drugs, or dietary supplements you use. Also tell them if you smoke, drink alcohol, or use illegal drugs. Some items may interact with your medicine. What should I watch for while using this medicine? Visit your doctor or health care professional for regular checks on your progress. During the first week, your symptoms may get worse, but then will improve as you continue your treatment. You may get hot flashes, increased bone pain, increased difficulty passing urine, or an aggravation of nerve symptoms. Discuss these effects with your doctor or health care professional, some of them may improve with continued use of this medicine. Male patients may experience a menstrual cycle or spotting during the first 2 months of therapy with this medicine. If this continues, contact your doctor or health care professional. This medicine may increase blood sugar. Ask your healthcare provider if changes in diet or medicines are needed if   you have diabetes. What side effects may I notice from receiving this medicine? Side effects that you should report to your doctor or health care professional as soon as possible:  allergic reactions like skin rash, itching or hives, swelling of the face, lips, or tongue  breathing problems  chest  pain  depression or memory disorders  pain in your legs or groin  pain at site where injected  severe headache  signs and symptoms of high blood sugar such as being more thirsty or hungry or having to urinate more than normal. You may also feel very tired or have blurry vision  swelling of the feet and legs  visual changes  vomiting Side effects that usually do not require medical attention (report to your doctor or health care professional if they continue or are bothersome):  breast swelling or tenderness  decrease in sex drive or performance  diarrhea  hot flashes  loss of appetite  muscle, joint, or bone pains  nausea  redness or irritation at site where injected  skin problems or acne This list may not describe all possible side effects. Call your doctor for medical advice about side effects. You may report side effects to FDA at 1-800-FDA-1088. Where should I keep my medicine? Keep out of the reach of children. Store below 25 degrees C (77 degrees F). Do not freeze. Protect from light. Do not use if it is not clear or if there are particles present. Throw away any unused medicine after the expiration date. NOTE: This sheet is a summary. It may not cover all possible information. If you have questions about this medicine, talk to your doctor, pharmacist, or health care provider.  2020 [employer]vier/Gold Standard (2018-10-08 09:52:48)  

## 2020-04-14 NOTE — Progress Notes (Signed)
Hematology and Oncology Follow Up Visit  Benjamin Brown [id number] Mar 29, 1946 74 y.o. 04/14/2020 9:34 AM Benjamin Brown, MDHill, Benjamin Sages, MD   Principle Diagnosis: 74 year old man with castration-resistant prostate cancer with disease to the bone diagnosed in 2014.  He was found to have Gleason score 4+4 = 8 and a PSA of 71.9 at the time of diagnosis.  Current therapy:  Lupron 30 mg injection every 4 months.   Xtandi 160 mg daily started on October 11, 2017.  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  Since the last visit, he reports no major changes in his health.  He has retired from [employer] at this time without any recent health issues.  Continues to tolerate Xtandi without any complications. He denied any nausea, vomiting or abdominal pain.  He denies any hematochezia or melena.  Does report some frequency and urgency and occasional incontinence.       Medications: Reviewed without any changes. Current Outpatient Medications  Medication Sig Dispense Refill  . XTANDI 40 MG capsule TAKE 4 CAPSULES (160 MG TOTAL) BY MOUTH DAILY. 120 capsule 0   No current facility-administered medications for this visit.     Allergies: No Known Allergies     Physical Exam:  Blood pressure (!) 145/78, pulse 80, temperature 98.2 F (36.8 C), temperature source Temporal, resp. rate 18, weight 234 lb (106.1 kg), SpO2 97 %.     ECOG 0    General appearance: Alert, awake without any distress. Head: Atraumatic without abnormalities Oropharynx: Without any thrush or ulcers. Eyes: No scleral icterus. Lymph nodes: No lymphadenopathy noted in the cervical, supraclavicular, or axillary nodes Heart:regular rate and rhythm, without any murmurs or gallops.   Lung: Clear to auscultation without any rhonchi, wheezes or dullness to percussion. Abdomin: Soft, nontender without any shifting dullness or ascites. Musculoskeletal: No clubbing or cyanosis. Neurological: No motor or  sensory deficits. Skin: No rashes or lesions. Psychiatric: Mood and affect appeared normal.          CBC    Component Value Date/Time   WBC 4.7 04/07/2020 0836   WBC 4.0 12/19/2017 1003   RBC 4.63 04/07/2020 0836   HGB 14.0 04/07/2020 0836   HGB 12.9 (L) 12/19/2017 1003   HCT 42.5 04/07/2020 0836   HCT 39.9 12/19/2017 1003   PLT 217 04/07/2020 0836   PLT 226 12/19/2017 1003   MCV 91.8 04/07/2020 0836   MCV 89.6 12/19/2017 1003   MCH 30.2 04/07/2020 0836   MCHC 32.9 04/07/2020 0836   RDW 13.4 04/07/2020 0836   RDW 14.6 12/19/2017 1003   LYMPHSABS 2.5 04/07/2020 0836   LYMPHSABS 1.9 12/19/2017 1003   MONOABS 0.4 04/07/2020 0836   MONOABS 0.3 12/19/2017 1003   EOSABS 0.1 04/07/2020 0836   EOSABS 0.1 12/19/2017 1003   BASOSABS 0.0 04/07/2020 0836   BASOSABS 0.0 12/19/2017 1003     Chemistry      Component Value Date/Time   NA 141 04/07/2020 0836   NA 142 12/19/2017 1003   K 4.1 04/07/2020 0836   K 4.6 12/19/2017 1003   CL 106 04/07/2020 0836   CO2 23 04/07/2020 0836   CO2 27 12/19/2017 1003   BUN 19 04/07/2020 0836   BUN 15.9 12/19/2017 1003   CREATININE 0.95 04/07/2020 0836   CREATININE 1.0 12/19/2017 1003      Component Value Date/Time   CALCIUM 9.2 04/07/2020 0836   CALCIUM 9.6 12/19/2017 1003   ALKPHOS 145 (H) 04/07/2020 [id number]  ALKPHOS 132 12/19/2017 1003   AST 13 (L) 04/07/2020 0836   AST 13 12/19/2017 1003   ALT 8 04/07/2020 0836   ALT 10 12/19/2017 1003   BILITOT 0.9 04/07/2020 0836   BILITOT 0.54 12/19/2017 1003       Results for Benjamin Brown, Benjamin Brown (MRN [id number]) as of 04/14/2020 08:53  Ref. Range 12/03/2019 08:28 04/07/2020 08:36  Prostate Specific Ag, Serum Latest Ref Range: 0.0 - 4.0 ng/mL <0.1 <0.1     Impression and Plan:  74 year old man with:  1.  Castration-resistant prostate cancer with limited disease to the bone noted in 2018.  He continues to be on Xtandi with excellent clinical and PSA response.  The natural course of this  disease was updated risks and benefits of continuing Xtandi were reviewed.  Tensional complications including hypertension, hematuria and rarely seizures were reiterated.  Alternative options would be systemic chemotherapy among others.  I recommended continuing the same dose and schedule and updating staging before the next evaluation.  He will have a CT scan and bone scan at that time.   2. Androgen deprivation: He will receive Eligard today and repeated in 4 months.  Complications that include hot flashes, weight gain among others were reiterated.  3. Bone directed therapy: I recommended calcium and vitamin D to be continued at this time.  Bone directed therapy would be an option in the future if he develops worsening metastatic disease.  4. Follow-up: In 4 months for follow-up.  30  minutes were dedicated to this visit. The time was spent on reviewing laboratory data, discussing treatment options,  and answering questions regarding future plan.      Zola Button, MD 4/23/20219:34 AM

## 2020-04-17 ENCOUNTER — Telehealth: Payer: Self-pay | Admitting: Oncology

## 2020-04-17 NOTE — Telephone Encounter (Signed)
Scheduled appt per 4/23 los.  Spoke with pt and he is aware of his appt date and time,

## 2020-04-20 ENCOUNTER — Other Ambulatory Visit: Payer: Self-pay | Admitting: Oncology

## 2020-04-20 DIAGNOSIS — C61 Malignant neoplasm of prostate: Secondary | ICD-10-CM

## 2020-04-27 MED FILL — XTANDI 40 MG CAPSULE: 40 | 30 days supply | Qty: 120 | Fill #0

## 2020-05-23 ENCOUNTER — Other Ambulatory Visit: Payer: Self-pay | Admitting: Oncology

## 2020-05-23 DIAGNOSIS — C61 Malignant neoplasm of prostate: Secondary | ICD-10-CM

## 2020-05-24 ENCOUNTER — Other Ambulatory Visit: Payer: Self-pay | Admitting: Oncology

## 2020-05-24 ENCOUNTER — Other Ambulatory Visit: Payer: Self-pay

## 2020-05-24 DIAGNOSIS — C61 Malignant neoplasm of prostate: Secondary | ICD-10-CM

## 2020-05-24 MED ORDER — ENZALUTAMIDE 40 MG PO TABS: 160.0000 mg | ORAL_TABLET | Freq: Every day | ORAL | 0 refills | Status: DC

## 2020-05-29 DIAGNOSIS — H6123 Impacted cerumen, bilateral: Secondary | ICD-10-CM | POA: Diagnosis not present

## 2020-05-29 DIAGNOSIS — R030 Elevated blood-pressure reading, without diagnosis of hypertension: Secondary | ICD-10-CM | POA: Diagnosis not present

## 2020-05-29 DIAGNOSIS — C61 Malignant neoplasm of prostate: Secondary | ICD-10-CM | POA: Diagnosis not present

## 2020-06-21 ENCOUNTER — Other Ambulatory Visit: Payer: Self-pay | Admitting: Oncology

## 2020-06-26 MED FILL — XTANDI 40 MG TABS: 40 | 30 days supply | Qty: 120 | Fill #0

## 2020-07-19 ENCOUNTER — Other Ambulatory Visit: Payer: Self-pay | Admitting: Oncology

## 2020-07-26 MED FILL — XTANDI 40 MG TABS: 40 | 30 days supply | Qty: 120 | Fill #0

## 2020-08-11 ENCOUNTER — Encounter (HOSPITAL_COMMUNITY)
Admission: RE | Admit: 2020-08-11 | Discharge: 2020-08-11 | Disposition: A | Discharge status: Home / Self Care | Payer: Medicare Other | Source: Ambulatory Visit | Attending: Oncology | Admitting: Oncology

## 2020-08-11 ENCOUNTER — Other Ambulatory Visit: Payer: Self-pay

## 2020-08-11 ENCOUNTER — Inpatient Hospital Stay: Payer: Medicare Other | Attending: Oncology

## 2020-08-11 ENCOUNTER — Ambulatory Visit (HOSPITAL_COMMUNITY)
Admission: RE | Admit: 2020-08-11 | Discharge: 2020-08-11 | Disposition: A | Discharge status: Home / Self Care | Payer: Medicare Other | Source: Ambulatory Visit | Attending: Oncology | Admitting: Oncology

## 2020-08-11 DIAGNOSIS — Z5111 Encounter for antineoplastic chemotherapy: Secondary | ICD-10-CM | POA: Diagnosis not present

## 2020-08-11 DIAGNOSIS — M19071 Primary osteoarthritis, right ankle and foot: Secondary | ICD-10-CM | POA: Diagnosis not present

## 2020-08-11 DIAGNOSIS — Z8546 Personal history of malignant neoplasm of prostate: Secondary | ICD-10-CM | POA: Diagnosis not present

## 2020-08-11 DIAGNOSIS — M19011 Primary osteoarthritis, right shoulder: Secondary | ICD-10-CM | POA: Diagnosis not present

## 2020-08-11 DIAGNOSIS — C61 Malignant neoplasm of prostate: Secondary | ICD-10-CM | POA: Insufficient documentation

## 2020-08-11 DIAGNOSIS — N281 Cyst of kidney, acquired: Secondary | ICD-10-CM | POA: Diagnosis not present

## 2020-08-11 DIAGNOSIS — E785 Hyperlipidemia, unspecified: Secondary | ICD-10-CM | POA: Diagnosis not present

## 2020-08-11 DIAGNOSIS — M19012 Primary osteoarthritis, left shoulder: Secondary | ICD-10-CM | POA: Diagnosis not present

## 2020-08-11 DIAGNOSIS — M170 Bilateral primary osteoarthritis of knee: Secondary | ICD-10-CM | POA: Diagnosis not present

## 2020-08-11 DIAGNOSIS — I10 Essential (primary) hypertension: Secondary | ICD-10-CM | POA: Diagnosis not present

## 2020-08-11 DIAGNOSIS — K7689 Other specified diseases of liver: Secondary | ICD-10-CM | POA: Diagnosis not present

## 2020-08-11 DIAGNOSIS — K429 Umbilical hernia without obstruction or gangrene: Secondary | ICD-10-CM | POA: Diagnosis not present

## 2020-08-11 LAB — CBC WITH DIFFERENTIAL (CANCER CENTER ONLY)
Abs Immature Granulocytes: 0 10*3/uL (ref 0.00–0.07)
Basophils Absolute: 0 10*3/uL (ref 0.0–0.1)
Basophils Relative: 0 %
Eosinophils Absolute: 0.1 10*3/uL (ref 0.0–0.5)
Eosinophils Relative: 1 %
HCT: 39.9 % (ref 39.0–52.0)
Hemoglobin: 13.4 g/dL (ref 13.0–17.0)
Immature Granulocytes: 0 %
Lymphocytes Relative: 55 %
Lymphs Abs: 2.2 10*3/uL (ref 0.7–4.0)
MCH: 30.1 pg (ref 26.0–34.0)
MCHC: 33.6 g/dL (ref 30.0–36.0)
MCV: 89.7 fL (ref 80.0–100.0)
Monocytes Absolute: 0.3 10*3/uL (ref 0.1–1.0)
Monocytes Relative: 7 %
Neutro Abs: 1.6 10*3/uL — ABNORMAL LOW (ref 1.7–7.7)
Neutrophils Relative %: 37 %
Platelet Count: 209 10*3/uL (ref 150–400)
RBC: 4.45 MIL/uL (ref 4.22–5.81)
RDW: 13.2 % (ref 11.5–15.5)
WBC Count: 4.2 10*3/uL (ref 4.0–10.5)
nRBC: 0 % (ref 0.0–0.2)

## 2020-08-11 LAB — CMP (CANCER CENTER ONLY)
ALT: 7 U/L (ref 0–44)
AST: 15 U/L (ref 15–41)
Albumin: 3.5 g/dL (ref 3.5–5.0)
Alkaline Phosphatase: 143 U/L — ABNORMAL HIGH (ref 38–126)
Anion gap: 11 (ref 5–15)
BUN: 15 mg/dL (ref 8–23)
CO2: 22 mmol/L (ref 22–32)
Calcium: 9.8 mg/dL (ref 8.9–10.3)
Chloride: 107 mmol/L (ref 98–111)
Creatinine: 1 mg/dL (ref 0.61–1.24)
GFR, Est AFR Am: >60 mL/min (ref >60)
GFR, Estimated: >60 mL/min (ref >60)
Glucose, Bld: 99 mg/dL (ref 70–99)
Potassium: 4 mmol/L (ref 3.5–5.1)
Sodium: 140 mmol/L (ref 135–145)
Total Bilirubin: 1.1 mg/dL (ref 0.3–1.2)
Total Protein: 7.5 g/dL (ref 6.5–8.1)

## 2020-08-11 MED ORDER — SODIUM CHLORIDE (PF) 0.9 % IJ SOLN
INTRAMUSCULAR | Status: AC
  Filled 2020-08-11: qty 50

## 2020-08-11 MED ORDER — TECHNETIUM TC 99M MEDRONATE IV KIT
21.1000 | PACK | Freq: Once | INTRAVENOUS | Status: AC
  Administered 2020-08-11: 21.1 via INTRAVENOUS

## 2020-08-11 MED ORDER — IOHEXOL 300 MG/ML  SOLN
100.0000 mL | Freq: Once | INTRAMUSCULAR | Status: AC | PRN
  Administered 2020-08-11: 100 mL via INTRAVENOUS

## 2020-08-12 LAB — PROSTATE-SPECIFIC AG, SERUM (LABCORP): Prostate Specific Ag, Serum: <0.1 ng/mL (ref 0.0–4.0)

## 2020-08-16 ENCOUNTER — Other Ambulatory Visit: Payer: Self-pay | Admitting: Oncology

## 2020-08-18 ENCOUNTER — Other Ambulatory Visit: Payer: Self-pay

## 2020-08-18 ENCOUNTER — Inpatient Hospital Stay (HOSPITAL_BASED_OUTPATIENT_CLINIC_OR_DEPARTMENT_OTHER): Payer: Medicare Other | Admitting: Oncology

## 2020-08-18 ENCOUNTER — Inpatient Hospital Stay: Payer: Medicare Other

## 2020-08-18 VITALS — BP 151/78 | HR 78 | Temp 97.4°F | Resp 20 | Ht 65.0 in | Wt 229.8 lb

## 2020-08-18 DIAGNOSIS — Z5111 Encounter for antineoplastic chemotherapy: Secondary | ICD-10-CM | POA: Diagnosis not present

## 2020-08-18 DIAGNOSIS — C61 Malignant neoplasm of prostate: Secondary | ICD-10-CM | POA: Diagnosis not present

## 2020-08-18 MED ORDER — LEUPROLIDE ACETATE (4 MONTH) 30 MG ~~LOC~~ KIT
PACK | SUBCUTANEOUS | Status: AC
  Filled 2020-08-18: qty 30

## 2020-08-18 MED ORDER — LEUPROLIDE ACETATE (4 MONTH) 30 MG ~~LOC~~ KIT
30.0000 mg | PACK | Freq: Once | SUBCUTANEOUS | Status: AC
  Administered 2020-08-18: 30 mg via SUBCUTANEOUS

## 2020-08-18 NOTE — Patient Instructions (Signed)

## 2020-08-18 NOTE — Progress Notes (Signed)
Hematology and Oncology Follow Up Visit  Benjamin Brown [id number] [birth year] 74 y.o. 08/18/2020 10:40 AM Benjamin Brown, MDHill, Berneta Sages, MD   Principle Diagnosis: 74 year old man with prostate cancer diagnosed in 2014.  He was found to have Gleason score 4+4 = 8 and a PSA of 71.9 and currently has castration-resistant disease.  Current therapy:  Eligard 30 mg every 4 months.  He will receive one today and repeated in December.  Xtandi 160 mg daily started on October 11, 2017.  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  Since the last visit, he reports feeling well without any major complaints.  He denies any nausea, vomiting or abdominal pain.  Denies any bone pain or pathological fractures.  He did report some occasional dyspepsia but no weight loss or appetite changes.       Medications: Updated on review. Current Outpatient Medications  Medication Sig Dispense Refill  . XTANDI 40 MG tablet TAKE 4 TABLETS (160 MG TOTAL) BY MOUTH DAILY. 120 tablet 0   No current facility-administered medications for this visit.     Allergies: No Known Allergies     Physical Exam:  Blood pressure (!) 151/78, pulse 78, temperature (!) 97.4 F (36.3 C), temperature source Tympanic, resp. rate 20, height 5\' 5"  (1.651 m), weight 229 lb 12.8 oz (104.2 kg), SpO2 97 %.     ECOG 0    General appearance: Comfortable appearing without any discomfort Head: Normocephalic without any trauma Oropharynx: Mucous membranes are moist and pink without any thrush or ulcers. Eyes: Pupils are equal and round reactive to light. Lymph nodes: No cervical, supraclavicular, inguinal or axillary lymphadenopathy.   Heart:regular rate and rhythm.  S1 and S2 without leg edema. Lung: Clear without any rhonchi or wheezes.  No dullness to percussion. Abdomin: Soft, nontender, nondistended with good bowel sounds.  No hepatosplenomegaly. Musculoskeletal: No joint deformity or effusion.  Full  range of motion noted. Neurological: No deficits noted on motor, sensory and deep tendon reflex exam. Skin: No petechial rash or dryness.  Appeared moist.            CBC    Component Value Date/Time   WBC 4.2 08/11/2020 0755   WBC 4.0 12/19/2017 1003   RBC 4.45 08/11/2020 0755   HGB 13.4 08/11/2020 0755   HGB 12.9 (L) 12/19/2017 1003   HCT 39.9 08/11/2020 0755   HCT 39.9 12/19/2017 1003   PLT 209 08/11/2020 0755   PLT 226 12/19/2017 1003   MCV 89.7 08/11/2020 0755   MCV 89.6 12/19/2017 1003   MCH 30.1 08/11/2020 0755   MCHC 33.6 08/11/2020 0755   RDW 13.2 08/11/2020 0755   RDW 14.6 12/19/2017 1003   LYMPHSABS 2.2 08/11/2020 0755   LYMPHSABS 1.9 12/19/2017 1003   MONOABS 0.3 08/11/2020 0755   MONOABS 0.3 12/19/2017 1003   EOSABS 0.1 08/11/2020 0755   EOSABS 0.1 12/19/2017 1003   BASOSABS 0.0 08/11/2020 0755   BASOSABS 0.0 12/19/2017 1003     Chemistry      Component Value Date/Time   NA 140 08/11/2020 0755   NA 142 12/19/2017 1003   K 4.0 08/11/2020 0755   K 4.6 12/19/2017 1003   CL 107 08/11/2020 0755   CO2 22 08/11/2020 0755   CO2 27 12/19/2017 1003   BUN 15 08/11/2020 0755   BUN 15.9 12/19/2017 1003   CREATININE 1.00 08/11/2020 0755   CREATININE 1.0 12/19/2017 1003      Component Value Date/Time  CALCIUM 9.8 08/11/2020 0755   CALCIUM 9.6 12/19/2017 1003   ALKPHOS 143 (H) 08/11/2020 0755   ALKPHOS 132 12/19/2017 1003   AST 15 08/11/2020 0755   AST 13 12/19/2017 1003   ALT 7 08/11/2020 0755   ALT 10 12/19/2017 1003   BILITOT 1.1 08/11/2020 0755   BILITOT 0.54 12/19/2017 1003         Results for Benjamin Brown, Benjamin Brown (MRN [id number]) as of 08/18/2020 10:42  Ref. Range 04/07/2020 08:36 08/11/2020 07:55  Prostate Specific Ag, Serum Latest Ref Range: 0.0 - 4.0 ng/mL <0.1 <0.1   IMPRESSION: 1. No findings of prostate cancer nodal metastasis or solid organ metastasis. No evidence for bone metastasis. 2. Similar appearance of cortical thickening and  accentuation of the trabecular markings involving the L4 vertebra which as mentioned previously is compatible with Paget's disease. 3. Multifocal lucencies within the sacrum and bilateral iliac bones are again noted. In the setting of heterogeneous bone marrow these are favored to represent areas of fatty replacement of normal bone Marrow.   IMPRESSION: 1. Stable bone scan, with no findings specific for metastatic disease to bone. 2. 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  Advanced prostate cancer with limited disease to the bone diagnosed in 2018.  He has castration-resistant disease.  The natural course of his disease was reviewed today and staging work-up was also updated.  CT scan and bone scan on 08/11/2020 were personally reviewed and discussed with him in detail.  He does not have any evidence of visceral metastasis and his bone disease appears to be marginal at this time.  His L4 lesion is consistent with more with Paget's disease in his pelvic involvement could also be benign of any etiology.  Risks and benefits of continuing Xtandi were reviewed today.  Potential complications such as hypertension hematuria and others were reviewed.  He is agreeable to continue at this time.   2. Androgen deprivation: He is currently on Eligard without any complications.  Long-term side effects associated with this treatment including hot flashes weight gain among others were reviewed.  Agreeable to continue at this time.  3. Bone directed therapy: He is currently on calcium and vitamin D supplements I recommended.  See no need for Xgeva at this time.  4. Follow-up: In 4 months for repeat evaluation.  30  minutes were spent on this visit.  The time was dedicated to reviewing his imaging studies, disease status update and future plan of care review.      Zola Button, MD 8/27/202110:40 AM

## 2020-08-24 MED FILL — XTANDI 40 MG TABS: 40 | 30 days supply | Qty: 120 | Fill #0

## 2020-09-22 ENCOUNTER — Other Ambulatory Visit: Payer: Self-pay | Admitting: Oncology

## 2020-09-25 MED FILL — XTANDI 40 MG TABS: 40 | 30 days supply | Qty: 120 | Fill #0

## 2020-10-17 ENCOUNTER — Other Ambulatory Visit: Payer: Self-pay | Admitting: Oncology

## 2020-10-31 MED FILL — XTANDI 40 MG TABS: 40 | 30 days supply | Qty: 120 | Fill #0

## 2020-11-24 ENCOUNTER — Telehealth: Payer: Self-pay | Admitting: Oncology

## 2020-11-24 NOTE — Telephone Encounter (Signed)
Scheduled per los, patient has been called and notified of upcoming appointments. 

## 2020-11-29 ENCOUNTER — Other Ambulatory Visit: Payer: Self-pay | Admitting: Oncology

## 2020-11-29 ENCOUNTER — Other Ambulatory Visit (HOSPITAL_COMMUNITY): Payer: Self-pay | Admitting: Oncology

## 2020-11-30 ENCOUNTER — Other Ambulatory Visit: Payer: Self-pay | Admitting: Oncology

## 2020-11-30 DIAGNOSIS — C61 Malignant neoplasm of prostate: Secondary | ICD-10-CM

## 2020-11-30 MED FILL — XTANDI 40 MG TABS: 40 | 30 days supply | Qty: 120 | Fill #0

## 2020-12-01 ENCOUNTER — Inpatient Hospital Stay: Payer: Medicare Other | Attending: Oncology

## 2020-12-01 ENCOUNTER — Other Ambulatory Visit: Payer: Self-pay

## 2020-12-01 DIAGNOSIS — C61 Malignant neoplasm of prostate: Secondary | ICD-10-CM | POA: Insufficient documentation

## 2020-12-01 DIAGNOSIS — C7951 Secondary malignant neoplasm of bone: Secondary | ICD-10-CM | POA: Diagnosis not present

## 2020-12-01 DIAGNOSIS — Z5111 Encounter for antineoplastic chemotherapy: Secondary | ICD-10-CM | POA: Insufficient documentation

## 2020-12-01 LAB — CBC WITH DIFFERENTIAL (CANCER CENTER ONLY)
Abs Immature Granulocytes: 0.01 10*3/uL (ref 0.00–0.07)
Basophils Absolute: 0 10*3/uL (ref 0.0–0.1)
Basophils Relative: 1 %
Eosinophils Absolute: 0.1 10*3/uL (ref 0.0–0.5)
Eosinophils Relative: 2 %
HCT: 40.3 % (ref 39.0–52.0)
Hemoglobin: 13 g/dL (ref 13.0–17.0)
Immature Granulocytes: 0 %
Lymphocytes Relative: 54 %
Lymphs Abs: 2.3 10*3/uL (ref 0.7–4.0)
MCH: 30.3 pg (ref 26.0–34.0)
MCHC: 32.3 g/dL (ref 30.0–36.0)
MCV: 93.9 fL (ref 80.0–100.0)
Monocytes Absolute: 0.3 10*3/uL (ref 0.1–1.0)
Monocytes Relative: 8 %
Neutro Abs: 1.4 10*3/uL — ABNORMAL LOW (ref 1.7–7.7)
Neutrophils Relative %: 35 %
Platelet Count: 213 10*3/uL (ref 150–400)
RBC: 4.29 MIL/uL (ref 4.22–5.81)
RDW: 13.6 % (ref 11.5–15.5)
WBC Count: 4.1 10*3/uL (ref 4.0–10.5)
nRBC: 0 % (ref 0.0–0.2)

## 2020-12-01 LAB — CMP (CANCER CENTER ONLY)
ALT: 10 U/L (ref 0–44)
AST: 13 U/L — ABNORMAL LOW (ref 15–41)
Albumin: 3.5 g/dL (ref 3.5–5.0)
Alkaline Phosphatase: 144 U/L — ABNORMAL HIGH (ref 38–126)
Anion gap: 8 (ref 5–15)
BUN: 17 mg/dL (ref 8–23)
CO2: 26 mmol/L (ref 22–32)
Calcium: 9.5 mg/dL (ref 8.9–10.3)
Chloride: 106 mmol/L (ref 98–111)
Creatinine: 0.98 mg/dL (ref 0.61–1.24)
GFR, Estimated: >60 mL/min (ref >60)
Glucose, Bld: 96 mg/dL (ref 70–99)
Potassium: 4.2 mmol/L (ref 3.5–5.1)
Sodium: 140 mmol/L (ref 135–145)
Total Bilirubin: 0.9 mg/dL (ref 0.3–1.2)
Total Protein: 7.3 g/dL (ref 6.5–8.1)

## 2020-12-02 LAB — PROSTATE-SPECIFIC AG, SERUM (LABCORP): Prostate Specific Ag, Serum: <0.1 ng/mL (ref 0.0–4.0)

## 2020-12-08 ENCOUNTER — Other Ambulatory Visit: Payer: Self-pay

## 2020-12-08 ENCOUNTER — Inpatient Hospital Stay: Payer: Medicare Other

## 2020-12-08 ENCOUNTER — Inpatient Hospital Stay (HOSPITAL_BASED_OUTPATIENT_CLINIC_OR_DEPARTMENT_OTHER): Payer: Medicare Other | Admitting: Oncology

## 2020-12-08 VITALS — BP 158/83 | HR 63 | Temp 97.7°F | Resp 18 | Ht 65.0 in | Wt 216.9 lb

## 2020-12-08 DIAGNOSIS — C61 Malignant neoplasm of prostate: Secondary | ICD-10-CM | POA: Diagnosis not present

## 2020-12-08 DIAGNOSIS — C7951 Secondary malignant neoplasm of bone: Secondary | ICD-10-CM | POA: Diagnosis not present

## 2020-12-08 DIAGNOSIS — Z5111 Encounter for antineoplastic chemotherapy: Secondary | ICD-10-CM | POA: Diagnosis not present

## 2020-12-08 MED ORDER — LEUPROLIDE ACETATE (4 MONTH) 30 MG ~~LOC~~ KIT
30.0000 mg | PACK | Freq: Once | SUBCUTANEOUS | Status: AC
  Administered 2020-12-08: 11:00:00 30 mg via SUBCUTANEOUS

## 2020-12-08 MED ORDER — LEUPROLIDE ACETATE (4 MONTH) 30 MG ~~LOC~~ KIT
PACK | SUBCUTANEOUS | Status: AC
  Filled 2020-12-08: qty 30

## 2020-12-08 NOTE — Patient Instructions (Signed)

## 2020-12-08 NOTE — Progress Notes (Signed)
Hematology and Oncology Follow Up Visit  Benjamin Brown [id number] 07-Apr-1946 74 y.o. 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  He was found to have Gleason score 4+4 = 8 and a PSA of 71.9.   Current therapy:  Eligard 30 mg every 4 months.  He will receive one today and repeated in December.  Xtandi 160 mg daily started on October 11, 2017.  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  Since the last visit, he denies any recent complications or illnesses. He has been exercising regularly and has lost close to 15 pounds intentionally. He denies any bone pain or pathological fractures. He denies any hospitalization or illnesses.       Medications: Unchanged on review. Current Outpatient Medications  Medication Sig Dispense Refill  . XTANDI 40 MG tablet TAKE 4 TABLETS (160 MG TOTAL) BY MOUTH DAILY. 120 tablet 0   No current facility-administered medications for this visit.     Allergies: No Known Allergies     Physical Exam:   Blood pressure (!) 158/83, pulse 63, temperature 97.7 F (36.5 C), temperature source Tympanic, resp. rate 18, height 5\' 5"  (1.651 m), weight 216 lb 14.4 oz (98.4 kg), SpO2 98 %.     ECOG 0    General appearance: Alert, awake without any distress. Head: Atraumatic without abnormalities Oropharynx: Without any thrush or ulcers. Eyes: No scleral icterus. Lymph nodes: No lymphadenopathy noted in the cervical, supraclavicular, or axillary nodes Heart:regular rate and rhythm, without any murmurs or gallops.   Lung: Clear to auscultation without any rhonchi, wheezes or dullness to percussion. Abdomin: Soft, nontender without any shifting dullness or ascites. Musculoskeletal: No clubbing or cyanosis. Neurological: No motor or sensory deficits. Skin: Protrusion noted on his left upper lip. No erythema or  induration.              CBC    Component Value Date/Time   WBC 4.1 12/01/2020 1040   WBC 4.0 12/19/2017 1003   RBC 4.29 12/01/2020 1040   HGB 13.0 12/01/2020 1040   HGB 12.9 (L) 12/19/2017 1003   HCT 40.3 12/01/2020 1040   HCT 39.9 12/19/2017 1003   PLT 213 12/01/2020 1040   PLT 226 12/19/2017 1003   MCV 93.9 12/01/2020 1040   MCV 89.6 12/19/2017 1003   MCH 30.3 12/01/2020 1040   MCHC 32.3 12/01/2020 1040   RDW 13.6 12/01/2020 1040   RDW 14.6 12/19/2017 1003   LYMPHSABS 2.3 12/01/2020 1040   LYMPHSABS 1.9 12/19/2017 1003   MONOABS 0.3 12/01/2020 1040   MONOABS 0.3 12/19/2017 1003   EOSABS 0.1 12/01/2020 1040   EOSABS 0.1 12/19/2017 1003   BASOSABS 0.0 12/01/2020 1040   BASOSABS 0.0 12/19/2017 1003     Chemistry      Component Value Date/Time   NA 140 12/01/2020 1040   NA 142 12/19/2017 1003   K 4.2 12/01/2020 1040   K 4.6 12/19/2017 1003   CL 106 12/01/2020 1040   CO2 26 12/01/2020 1040   CO2 27 12/19/2017 1003   BUN 17 12/01/2020 1040   BUN 15.9 12/19/2017 1003   CREATININE 0.98 12/01/2020 1040   CREATININE 1.0 12/19/2017 1003      Component Value Date/Time   CALCIUM 9.5 12/01/2020 1040   CALCIUM 9.6 12/19/2017 1003   ALKPHOS 144 (H) 12/01/2020 1040   ALKPHOS 132 12/19/2017 1003   AST 13 (L)  12/01/2020 1040   AST 13 12/19/2017 1003   ALT 10 12/01/2020 1040   ALT 10 12/19/2017 1003   BILITOT 0.9 12/01/2020 1040   BILITOT 0.54 12/19/2017 1003     Results for Benjamin, Brown (MRN [id number]) as of 12/08/2020 10:54  Ref. Range 08/11/2020 07:55 12/01/2020 10:40  Prostate Specific Ag, Serum Latest Ref Range: 0.0 - 4.0 ng/mL <0.1 <0.1      Impression and Plan:  74 year old man with:  1. 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  He continues to be on Xtandi without any major complications. His PSA remains undetectable. Risks and benefits of continuing this treatment long-term were discussed.  Complications including weight gain, hypertension and rarely seizures were reiterated. He is agreeable to continue at this time. Salvage therapy options like chemotherapy among others will be considered if he developed relapsed disease.   2. Androgen deprivation: He remains on Eligard without any major complications. He will receive it today and repeated in 4 months. Complications include weight gain and hot flashes were reviewed.   3. Bone directed therapy: I recommended calcium and vitamin D supplements.  4. Left lip lesion: Recommended plastic surgery evaluation for removal.  5. Follow-up: he will return in 4 months for a follow-up.  30  minutes were dedicated to this encounter. Time was spent on reviewing his disease status, discussing treatment options and future plan of care review.      Zola Button, MD 12/17/202110:53 AM

## 2020-12-11 DIAGNOSIS — Z23 Encounter for immunization: Secondary | ICD-10-CM | POA: Diagnosis not present

## 2020-12-18 ENCOUNTER — Other Ambulatory Visit (HOSPITAL_COMMUNITY): Payer: Self-pay | Admitting: Oncology

## 2020-12-18 ENCOUNTER — Other Ambulatory Visit: Payer: Self-pay | Admitting: Oncology

## 2020-12-26 MED FILL — XTANDI 40 MG TABS: 40 | 30 days supply | Qty: 120 | Fill #0

## 2021-01-17 ENCOUNTER — Other Ambulatory Visit (HOSPITAL_COMMUNITY): Payer: Self-pay | Admitting: Oncology

## 2021-01-17 ENCOUNTER — Other Ambulatory Visit: Payer: Self-pay | Admitting: Oncology

## 2021-01-25 MED FILL — XTANDI 40 MG TABS: 40 | 30 days supply | Qty: 120 | Fill #0

## 2021-02-19 ENCOUNTER — Other Ambulatory Visit (HOSPITAL_COMMUNITY): Payer: Self-pay | Admitting: Oncology

## 2021-02-19 ENCOUNTER — Other Ambulatory Visit: Payer: Self-pay | Admitting: Oncology

## 2021-02-26 MED FILL — XTANDI 40 MG TABS: 40 | 30 days supply | Qty: 120 | Fill #0

## 2021-02-28 DIAGNOSIS — D481 Neoplasm of uncertain behavior of connective and other soft tissue: Secondary | ICD-10-CM | POA: Diagnosis not present

## 2021-03-09 DIAGNOSIS — Z20822 Contact with and (suspected) exposure to covid-19: Secondary | ICD-10-CM | POA: Diagnosis not present

## 2021-03-09 DIAGNOSIS — Z9189 Other specified personal risk factors, not elsewhere classified: Secondary | ICD-10-CM | POA: Diagnosis not present

## 2021-03-13 DIAGNOSIS — D100 Benign neoplasm of lip: Secondary | ICD-10-CM | POA: Diagnosis not present

## 2021-03-13 DIAGNOSIS — D481 Neoplasm of uncertain behavior of connective and other soft tissue: Secondary | ICD-10-CM | POA: Diagnosis not present

## 2021-03-22 ENCOUNTER — Other Ambulatory Visit (HOSPITAL_COMMUNITY): Payer: Self-pay

## 2021-03-23 ENCOUNTER — Other Ambulatory Visit: Payer: Self-pay | Admitting: Oncology

## 2021-03-25 ENCOUNTER — Other Ambulatory Visit (HOSPITAL_COMMUNITY): Payer: Self-pay

## 2021-03-25 MED FILL — Enzalutamide Tab 40 MG: ORAL | 30 days supply | Qty: 120 | Fill #0 | Status: CN

## 2021-03-27 ENCOUNTER — Other Ambulatory Visit (HOSPITAL_COMMUNITY): Payer: Self-pay

## 2021-03-28 ENCOUNTER — Other Ambulatory Visit (HOSPITAL_COMMUNITY): Payer: Self-pay

## 2021-03-28 MED FILL — Enzalutamide Tab 40 MG: ORAL | 30 days supply | Qty: 120 | Fill #0 | Status: AC

## 2021-04-03 DIAGNOSIS — Z0000 Encounter for general adult medical examination without abnormal findings: Secondary | ICD-10-CM | POA: Diagnosis not present

## 2021-04-06 ENCOUNTER — Inpatient Hospital Stay: Payer: Medicare Other | Attending: Oncology

## 2021-04-06 DIAGNOSIS — C7951 Secondary malignant neoplasm of bone: Secondary | ICD-10-CM | POA: Insufficient documentation

## 2021-04-06 DIAGNOSIS — Z5111 Encounter for antineoplastic chemotherapy: Secondary | ICD-10-CM | POA: Insufficient documentation

## 2021-04-06 DIAGNOSIS — C61 Malignant neoplasm of prostate: Secondary | ICD-10-CM | POA: Insufficient documentation

## 2021-04-13 ENCOUNTER — Other Ambulatory Visit: Payer: Self-pay

## 2021-04-13 ENCOUNTER — Inpatient Hospital Stay (HOSPITAL_BASED_OUTPATIENT_CLINIC_OR_DEPARTMENT_OTHER): Payer: Medicare Other | Admitting: Oncology

## 2021-04-13 ENCOUNTER — Inpatient Hospital Stay: Payer: Medicare Other

## 2021-04-13 VITALS — BP 147/77 | HR 64 | Temp 96.5°F | Resp 18 | Wt 219.5 lb

## 2021-04-13 DIAGNOSIS — C61 Malignant neoplasm of prostate: Secondary | ICD-10-CM

## 2021-04-13 DIAGNOSIS — Z5111 Encounter for antineoplastic chemotherapy: Secondary | ICD-10-CM | POA: Diagnosis not present

## 2021-04-13 DIAGNOSIS — C7951 Secondary malignant neoplasm of bone: Secondary | ICD-10-CM | POA: Diagnosis not present

## 2021-04-13 LAB — CBC WITH DIFFERENTIAL (CANCER CENTER ONLY)
Abs Immature Granulocytes: 0 10*3/uL (ref 0.00–0.07)
Basophils Absolute: 0 10*3/uL (ref 0.0–0.1)
Basophils Relative: 0 %
Eosinophils Absolute: 0.1 10*3/uL (ref 0.0–0.5)
Eosinophils Relative: 2 %
HCT: 40.3 % (ref 39.0–52.0)
Hemoglobin: 13.4 g/dL (ref 13.0–17.0)
Immature Granulocytes: 0 %
Lymphocytes Relative: 59 %
Lymphs Abs: 2.5 10*3/uL (ref 0.7–4.0)
MCH: 30.5 pg (ref 26.0–34.0)
MCHC: 33.3 g/dL (ref 30.0–36.0)
MCV: 91.8 fL (ref 80.0–100.0)
Monocytes Absolute: 0.3 10*3/uL (ref 0.1–1.0)
Monocytes Relative: 7 %
Neutro Abs: 1.4 10*3/uL — ABNORMAL LOW (ref 1.7–7.7)
Neutrophils Relative %: 32 %
Platelet Count: 182 10*3/uL (ref 150–400)
RBC: 4.39 MIL/uL (ref 4.22–5.81)
RDW: 12.8 % (ref 11.5–15.5)
WBC Count: 4.2 10*3/uL (ref 4.0–10.5)
nRBC: 0 % (ref 0.0–0.2)

## 2021-04-13 LAB — CMP (CANCER CENTER ONLY)
ALT: 7 U/L (ref 0–44)
AST: 12 U/L — ABNORMAL LOW (ref 15–41)
Albumin: 3.6 g/dL (ref 3.5–5.0)
Alkaline Phosphatase: 127 U/L — ABNORMAL HIGH (ref 38–126)
Anion gap: 8 (ref 5–15)
BUN: 17 mg/dL (ref 8–23)
CO2: 25 mmol/L (ref 22–32)
Calcium: 9.3 mg/dL (ref 8.9–10.3)
Chloride: 107 mmol/L (ref 98–111)
Creatinine: 0.9 mg/dL (ref 0.61–1.24)
GFR, Estimated: >60 mL/min (ref >60)
Glucose, Bld: 95 mg/dL (ref 70–99)
Potassium: 4.5 mmol/L (ref 3.5–5.1)
Sodium: 140 mmol/L (ref 135–145)
Total Bilirubin: 0.8 mg/dL (ref 0.3–1.2)
Total Protein: 7.3 g/dL (ref 6.5–8.1)

## 2021-04-13 MED ORDER — LEUPROLIDE ACETATE (4 MONTH) 30 MG ~~LOC~~ KIT
30.0000 mg | PACK | Freq: Once | SUBCUTANEOUS | Status: AC
  Administered 2021-04-13: 30 mg via SUBCUTANEOUS

## 2021-04-13 MED ORDER — LEUPROLIDE ACETATE (4 MONTH) 30 MG ~~LOC~~ KIT
PACK | SUBCUTANEOUS | Status: AC
  Filled 2021-04-13: qty 30

## 2021-04-13 NOTE — Patient Instructions (Signed)

## 2021-04-13 NOTE — Progress Notes (Signed)
Hematology and Oncology Follow Up Visit  Benjamin Brown [id number] 07/31/1946 75 y.o. 04/13/2021 9:04 AM Benjamin Brown, MDHill, Benjamin Sages, MD   Principle Diagnosis: 75 year old man with prostate cancer diagnosed in 2014 with a Gleason score of 8 and a PSA of 71.9.  He subsequently developed castration-resistant in 2018.  Current therapy:  Eligard 30 mg every 4 months.  He will receive injection today and repeated in 4 months.  Xtandi 160 mg daily started on October 11, 2017.  Interim History:  Dr. 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  Since the last visit, he reports no major changes in his health.  He denies any recent decline in performance status or activity level.  Denies any bone pain or pathological fractures.  He continues to tolerate Xtandi without any recent changes.  He denies excessive fatigue or seizures.        Medications: Updated on review. Current Outpatient Medications  Medication Sig Dispense Refill  . enzalutamide (XTANDI) 40 MG tablet TAKE 4 TABLETS (160 MG TOTAL) BY MOUTH DAILY. 120 tablet 0  . enzalutamide (XTANDI) 40 MG tablet TAKE 4 TABLETS (160 MG TOTAL) BY MOUTH DAILY. 120 tablet 0  . enzalutamide (XTANDI) 40 MG tablet TAKE 4 TABLETS (160 MG TOTAL) BY MOUTH DAILY. 120 tablet 0  . enzalutamide (XTANDI) 40 MG tablet TAKE 4 TABLETS (160 MG TOTAL) BY MOUTH DAILY. 120 tablet 0  . enzalutamide (XTANDI) 40 MG tablet TAKE 4 TABLETS (160 MG TOTAL) BY MOUTH DAILY. 120 tablet 0   No current facility-administered medications for this visit.     Allergies: No Known Allergies     Physical Exam:    Blood pressure (!) 147/77, pulse 64, resp. rate 18, SpO2 100 %.     ECOG 0   General appearance: Comfortable appearing without any discomfort Head: Normocephalic without any trauma Oropharynx: Mucous membranes are moist and pink without any thrush or ulcers. Eyes: Pupils are equal and round reactive to light. Lymph nodes: No cervical, supraclavicular,  inguinal or axillary lymphadenopathy.   Heart:regular rate and rhythm.  S1 and S2 without leg edema. Lung: Clear without any rhonchi or wheezes.  No dullness to percussion. Abdomin: Soft, nontender, nondistended with good bowel sounds.  No hepatosplenomegaly. Musculoskeletal: No joint deformity or effusion.  Full range of motion noted. Neurological: No deficits noted on motor, sensory and deep tendon reflex exam. Skin: No petechial rash or dryness.  Appeared moist.               CBC    Component Value Date/Time   WBC 4.1 12/01/2020 1040   WBC 4.0 12/19/2017 1003   RBC 4.29 12/01/2020 1040   HGB 13.0 12/01/2020 1040   HGB 12.9 (L) 12/19/2017 1003   HCT 40.3 12/01/2020 1040   HCT 39.9 12/19/2017 1003   PLT 213 12/01/2020 1040   PLT 226 12/19/2017 1003   MCV 93.9 12/01/2020 1040   MCV 89.6 12/19/2017 1003   MCH 30.3 12/01/2020 1040   MCHC 32.3 12/01/2020 1040   RDW 13.6 12/01/2020 1040   RDW 14.6 12/19/2017 1003   LYMPHSABS 2.3 12/01/2020 1040   LYMPHSABS 1.9 12/19/2017 1003   MONOABS 0.3 12/01/2020 1040   MONOABS 0.3 12/19/2017 1003   EOSABS 0.1 12/01/2020 1040   EOSABS 0.1 12/19/2017 1003   BASOSABS 0.0 12/01/2020 1040   BASOSABS 0.0 12/19/2017 1003     Chemistry      Component Value Date/Time   NA 140 12/01/2020 1040   NA 142  12/19/2017 1003   K 4.2 12/01/2020 1040   K 4.6 12/19/2017 1003   CL 106 12/01/2020 1040   CO2 26 12/01/2020 1040   CO2 27 12/19/2017 1003   BUN 17 12/01/2020 1040   BUN 15.9 12/19/2017 1003   CREATININE 0.98 12/01/2020 1040   CREATININE 1.0 12/19/2017 1003      Component Value Date/Time   CALCIUM 9.5 12/01/2020 1040   CALCIUM 9.6 12/19/2017 1003   ALKPHOS 144 (H) 12/01/2020 1040   ALKPHOS 132 12/19/2017 1003   AST 13 (L) 12/01/2020 1040   AST 13 12/19/2017 1003   ALT 10 12/01/2020 1040   ALT 10 12/19/2017 1003   BILITOT 0.9 12/01/2020 1040   BILITOT 0.54 12/19/2017 1003     [place]  Results for Benjamin Brown, Benjamin Brown (MRN  [id number]) as of 04/13/2021 09:06  Ref. Range 08/11/2020 07:55 12/01/2020 10:40  Prostate Specific Ag, Serum Latest Ref Range: 0.0 - 4.0 ng/mL <0.1 <0.1     Impression and Plan:  75 year old man with:  1.  Advanced prostate cancer with disease to the bone diagnosed in 2018.  He has castration-resistant disease at this time.  His disease status was updated at this time and treatment options were reviewed.  His PSA in December 2021 continues to be undetectable without any major complications.  Risks and benefits of continuing this treatment were discussed.  Complication including hypertension, fatigue and rarely seizures were reiterated.  He is agreeable to continue at this time.   2. Androgen deprivation: I recommended continuing Eligard indefinitely.  This will be given today as scheduled.  Complications including weight gain, hot flashes were discussed.   3. Bone directed therapy: He is to continue calcium and vitamin D supplements.  4. Left lip lesion: He status post surgical resection by plastic surgery.  Well-healed at this time with benign pathology.   5. Follow-up: In 4 months for repeat evaluation.  30  minutes were spent on this visit.  The time was dedicated to reviewing laboratory data, disease status update and outlining future plan of care.      Benjamin Button, MD 4/22/20229:04 AM

## 2021-04-14 LAB — PROSTATE-SPECIFIC AG, SERUM (LABCORP): Prostate Specific Ag, Serum: <0.1 ng/mL (ref 0.0–4.0)

## 2021-04-20 ENCOUNTER — Other Ambulatory Visit (HOSPITAL_COMMUNITY): Payer: Self-pay

## 2021-04-20 ENCOUNTER — Other Ambulatory Visit: Payer: Self-pay | Admitting: Oncology

## 2021-04-20 MED ORDER — ENZALUTAMIDE 40 MG PO TABS
ORAL_TABLET | ORAL | 0 refills | Status: DC
  Filled 2021-04-20: qty 120, 30d supply, fill #0

## 2021-04-23 DIAGNOSIS — Z1212 Encounter for screening for malignant neoplasm of rectum: Secondary | ICD-10-CM | POA: Diagnosis not present

## 2021-04-23 DIAGNOSIS — Z1211 Encounter for screening for malignant neoplasm of colon: Secondary | ICD-10-CM | POA: Diagnosis not present

## 2021-04-26 ENCOUNTER — Other Ambulatory Visit (HOSPITAL_COMMUNITY): Payer: Self-pay

## 2021-04-30 DIAGNOSIS — D481 Neoplasm of uncertain behavior of connective and other soft tissue: Secondary | ICD-10-CM | POA: Diagnosis not present

## 2021-05-10 DIAGNOSIS — R195 Other fecal abnormalities: Secondary | ICD-10-CM | POA: Diagnosis not present

## 2021-05-23 ENCOUNTER — Other Ambulatory Visit (HOSPITAL_COMMUNITY): Payer: Self-pay

## 2021-05-23 ENCOUNTER — Other Ambulatory Visit: Payer: Self-pay | Admitting: Oncology

## 2021-05-23 MED ORDER — ENZALUTAMIDE 40 MG PO TABS
ORAL_TABLET | ORAL | 0 refills | Status: DC
  Filled 2021-05-23: qty 120, fill #0
  Filled 2021-05-28: qty 120, 30d supply, fill #0

## 2021-05-28 ENCOUNTER — Other Ambulatory Visit (HOSPITAL_COMMUNITY): Payer: Self-pay

## 2021-06-05 DIAGNOSIS — K635 Polyp of colon: Secondary | ICD-10-CM | POA: Diagnosis not present

## 2021-06-05 DIAGNOSIS — D123 Benign neoplasm of transverse colon: Secondary | ICD-10-CM | POA: Diagnosis not present

## 2021-06-05 DIAGNOSIS — D120 Benign neoplasm of cecum: Secondary | ICD-10-CM | POA: Diagnosis not present

## 2021-06-05 DIAGNOSIS — K648 Other hemorrhoids: Secondary | ICD-10-CM | POA: Diagnosis not present

## 2021-06-05 DIAGNOSIS — R195 Other fecal abnormalities: Secondary | ICD-10-CM | POA: Diagnosis not present

## 2021-06-08 DIAGNOSIS — D120 Benign neoplasm of cecum: Secondary | ICD-10-CM | POA: Diagnosis not present

## 2021-06-08 DIAGNOSIS — D123 Benign neoplasm of transverse colon: Secondary | ICD-10-CM | POA: Diagnosis not present

## 2021-06-08 DIAGNOSIS — K635 Polyp of colon: Secondary | ICD-10-CM | POA: Diagnosis not present

## 2021-06-19 ENCOUNTER — Other Ambulatory Visit: Payer: Self-pay | Admitting: Oncology

## 2021-06-19 ENCOUNTER — Other Ambulatory Visit (HOSPITAL_COMMUNITY): Payer: Self-pay

## 2021-06-19 MED ORDER — ENZALUTAMIDE 40 MG PO TABS
ORAL_TABLET | ORAL | 0 refills | Status: DC
  Filled 2021-06-26: qty 120, 30d supply, fill #0

## 2021-06-26 ENCOUNTER — Other Ambulatory Visit (HOSPITAL_COMMUNITY): Payer: Self-pay

## 2021-07-03 DIAGNOSIS — I10 Essential (primary) hypertension: Secondary | ICD-10-CM | POA: Diagnosis not present

## 2021-07-03 DIAGNOSIS — C61 Malignant neoplasm of prostate: Secondary | ICD-10-CM | POA: Diagnosis not present

## 2021-07-20 ENCOUNTER — Other Ambulatory Visit (HOSPITAL_COMMUNITY): Payer: Self-pay

## 2021-07-20 ENCOUNTER — Other Ambulatory Visit: Payer: Self-pay | Admitting: Oncology

## 2021-07-20 MED ORDER — ENZALUTAMIDE 40 MG PO TABS
ORAL_TABLET | ORAL | 0 refills | Status: DC
  Filled 2021-07-20: qty 120, 30d supply, fill #0

## 2021-07-26 ENCOUNTER — Other Ambulatory Visit (HOSPITAL_COMMUNITY): Payer: Self-pay

## 2021-08-07 ENCOUNTER — Other Ambulatory Visit (HOSPITAL_COMMUNITY): Payer: Self-pay

## 2021-08-13 ENCOUNTER — Other Ambulatory Visit: Payer: Self-pay

## 2021-08-13 ENCOUNTER — Inpatient Hospital Stay: Payer: Medicare Other | Attending: Oncology

## 2021-08-13 DIAGNOSIS — C61 Malignant neoplasm of prostate: Secondary | ICD-10-CM | POA: Insufficient documentation

## 2021-08-13 DIAGNOSIS — C7951 Secondary malignant neoplasm of bone: Secondary | ICD-10-CM | POA: Insufficient documentation

## 2021-08-13 DIAGNOSIS — Z5111 Encounter for antineoplastic chemotherapy: Secondary | ICD-10-CM | POA: Insufficient documentation

## 2021-08-13 LAB — CMP (CANCER CENTER ONLY)
ALT: 9 U/L (ref 0–44)
AST: 13 U/L — ABNORMAL LOW (ref 15–41)
Albumin: 3.6 g/dL (ref 3.5–5.0)
Alkaline Phosphatase: 146 U/L — ABNORMAL HIGH (ref 38–126)
Anion gap: 9 (ref 5–15)
BUN: 15 mg/dL (ref 8–23)
CO2: 23 mmol/L (ref 22–32)
Calcium: 9.4 mg/dL (ref 8.9–10.3)
Chloride: 108 mmol/L (ref 98–111)
Creatinine: 1.03 mg/dL (ref 0.61–1.24)
GFR, Estimated: >60 mL/min (ref >60)
Glucose, Bld: 99 mg/dL (ref 70–99)
Potassium: 4.4 mmol/L (ref 3.5–5.1)
Sodium: 140 mmol/L (ref 135–145)
Total Bilirubin: 1 mg/dL (ref 0.3–1.2)
Total Protein: 7.5 g/dL (ref 6.5–8.1)

## 2021-08-13 LAB — CBC WITH DIFFERENTIAL (CANCER CENTER ONLY)
Abs Immature Granulocytes: 0.01 10*3/uL (ref 0.00–0.07)
Basophils Absolute: 0 10*3/uL (ref 0.0–0.1)
Basophils Relative: 0 %
Eosinophils Absolute: 0.1 10*3/uL (ref 0.0–0.5)
Eosinophils Relative: 2 %
HCT: 39.9 % (ref 39.0–52.0)
Hemoglobin: 13.4 g/dL (ref 13.0–17.0)
Immature Granulocytes: 0 %
Lymphocytes Relative: 53 %
Lymphs Abs: 2 10*3/uL (ref 0.7–4.0)
MCH: 30.4 pg (ref 26.0–34.0)
MCHC: 33.6 g/dL (ref 30.0–36.0)
MCV: 90.5 fL (ref 80.0–100.0)
Monocytes Absolute: 0.3 10*3/uL (ref 0.1–1.0)
Monocytes Relative: 8 %
Neutro Abs: 1.4 10*3/uL — ABNORMAL LOW (ref 1.7–7.7)
Neutrophils Relative %: 37 %
Platelet Count: 193 10*3/uL (ref 150–400)
RBC: 4.41 MIL/uL (ref 4.22–5.81)
RDW: 13.1 % (ref 11.5–15.5)
WBC Count: 3.8 10*3/uL — ABNORMAL LOW (ref 4.0–10.5)
nRBC: 0 % (ref 0.0–0.2)

## 2021-08-14 LAB — PROSTATE-SPECIFIC AG, SERUM (LABCORP): Prostate Specific Ag, Serum: <0.1 ng/mL (ref 0.0–4.0)

## 2021-08-21 ENCOUNTER — Other Ambulatory Visit: Payer: Self-pay | Admitting: Oncology

## 2021-08-21 ENCOUNTER — Other Ambulatory Visit (HOSPITAL_COMMUNITY): Payer: Self-pay

## 2021-08-21 MED ORDER — ENZALUTAMIDE 40 MG PO TABS
ORAL_TABLET | ORAL | 0 refills | Status: DC
Start: 2021-08-21 — End: 2021-09-20
  Filled 2021-08-21: qty 120, 30d supply, fill #0

## 2021-08-22 ENCOUNTER — Inpatient Hospital Stay (HOSPITAL_BASED_OUTPATIENT_CLINIC_OR_DEPARTMENT_OTHER): Payer: Medicare Other | Admitting: Oncology

## 2021-08-22 ENCOUNTER — Other Ambulatory Visit: Payer: Self-pay

## 2021-08-22 ENCOUNTER — Inpatient Hospital Stay: Payer: Medicare Other

## 2021-08-22 VITALS — BP 151/87 | HR 74 | Temp 98.6°F | Resp 18 | Ht 65.0 in | Wt 219.1 lb

## 2021-08-22 DIAGNOSIS — C7951 Secondary malignant neoplasm of bone: Secondary | ICD-10-CM | POA: Diagnosis not present

## 2021-08-22 DIAGNOSIS — Z5111 Encounter for antineoplastic chemotherapy: Secondary | ICD-10-CM | POA: Diagnosis not present

## 2021-08-22 DIAGNOSIS — C61 Malignant neoplasm of prostate: Secondary | ICD-10-CM

## 2021-08-22 MED ORDER — LEUPROLIDE ACETATE (4 MONTH) 30 MG ~~LOC~~ KIT
30.0000 mg | PACK | Freq: Once | SUBCUTANEOUS | Status: AC
  Administered 2021-08-22: 30 mg via SUBCUTANEOUS
  Filled 2021-08-22: qty 30

## 2021-08-22 NOTE — Progress Notes (Signed)
Hematology and Oncology Follow Up Visit  Benjamin Brown [id number] 1946/07/01 75 y.o. 08/22/2021 9:25 AM Benjamin Brown, MDHill, Benjamin Sages, MD   Principle Diagnosis: 75 year old man with castration-resistant prostate cancer diagnosed in 2018.  He had presented and 2014 with a Gleason score of 8 and a PSA of 71.9.    Current therapy:  Eligard 30 mg every 4 months.  This will be repeated today and in 4 months.  Xtandi 160 mg daily started on October 11, 2017.  Interim History:  Dr. Tamala Brown is here for repeat follow-up visit.  Since her last visit, he reports no major changes in his health.  He continues to tolerate Xtandi without any major complaints.  He denies any nausea, vomiting or abdominal pain.  He does report some mild fatigue and tiredness which has not dramatically changed.  His performance status quality of life remained reasonable.        Medications: Reviewed without changes. Current Outpatient Medications  Medication Sig Dispense Refill   enzalutamide (XTANDI) 40 MG tablet TAKE 4 TABLETS (160 MG TOTAL) BY MOUTH DAILY. 120 tablet 0   enzalutamide (XTANDI) 40 MG tablet TAKE 4 TABLETS (160 MG TOTAL) BY MOUTH DAILY. 120 tablet 0   enzalutamide (XTANDI) 40 MG tablet TAKE 4 TABLETS (160 MG TOTAL) BY MOUTH DAILY. 120 tablet 0   enzalutamide (XTANDI) 40 MG tablet TAKE 4 TABLETS (160 MG TOTAL) BY MOUTH DAILY. 120 tablet 0   enzalutamide (XTANDI) 40 MG tablet TAKE 4 TABLETS (160 MG TOTAL) BY MOUTH DAILY. 120 tablet 0   No current facility-administered medications for this visit.     Allergies: No Known Allergies     Physical Exam:    Blood pressure (!) 151/87, pulse 74, temperature 98.6 F (37 C), temperature source Oral, resp. rate 18, height '5\' 5"'$  (1.651 m), weight 219 lb 1.6 oz (99.4 kg), SpO2 100 %.      ECOG 0     General appearance: Alert, awake without any distress. Head: Atraumatic without abnormalities Oropharynx: Without any thrush or ulcers. Eyes: No  scleral icterus. Lymph nodes: No lymphadenopathy noted in the cervical, supraclavicular, or axillary nodes Heart:regular rate and rhythm, without any murmurs or gallops.   Lung: Clear to auscultation without any rhonchi, wheezes or dullness to percussion. Abdomin: Soft, nontender without any shifting dullness or ascites. Musculoskeletal: No clubbing or cyanosis. Neurological: No motor or sensory deficits. Skin: No rashes or lesions.               CBC    Component Value Date/Time   WBC 3.8 (L) 08/13/2021 0924   WBC 4.0 12/19/2017 1003   RBC 4.41 08/13/2021 0924   HGB 13.4 08/13/2021 0924   HGB 12.9 (L) 12/19/2017 1003   HCT 39.9 08/13/2021 0924   HCT 39.9 12/19/2017 1003   PLT 193 08/13/2021 0924   PLT 226 12/19/2017 1003   MCV 90.5 08/13/2021 0924   MCV 89.6 12/19/2017 1003   MCH 30.4 08/13/2021 0924   MCHC 33.6 08/13/2021 0924   RDW 13.1 08/13/2021 0924   RDW 14.6 12/19/2017 1003   LYMPHSABS 2.0 08/13/2021 0924   LYMPHSABS 1.9 12/19/2017 1003   MONOABS 0.3 08/13/2021 0924   MONOABS 0.3 12/19/2017 1003   EOSABS 0.1 08/13/2021 0924   EOSABS 0.1 12/19/2017 1003   BASOSABS 0.0 08/13/2021 0924   BASOSABS 0.0 12/19/2017 1003     Chemistry      Component Value Date/Time   NA 140 08/13/2021 0924   NA 142  12/19/2017 1003   K 4.4 08/13/2021 0924   K 4.6 12/19/2017 1003   CL 108 08/13/2021 0924   CO2 23 08/13/2021 0924   CO2 27 12/19/2017 1003   BUN 15 08/13/2021 0924   BUN 15.9 12/19/2017 1003   CREATININE 1.03 08/13/2021 0924   CREATININE 1.0 12/19/2017 1003      Component Value Date/Time   CALCIUM 9.4 08/13/2021 0924   CALCIUM 9.6 12/19/2017 1003   ALKPHOS 146 (H) 08/13/2021 0924   ALKPHOS 132 12/19/2017 1003   AST 13 (L) 08/13/2021 0924   AST 13 12/19/2017 1003   ALT 9 08/13/2021 0924   ALT 10 12/19/2017 1003   BILITOT 1.0 08/13/2021 0924   BILITOT 0.54 12/19/2017 1003      Results for Benjamin Brown (MRN [id number]) as of 08/22/2021 09:27   Ref. Range 04/13/2021 09:34 08/13/2021 09:24  Prostate Specific Ag, Serum Latest Ref Range: 0.0 - 4.0 ng/mL <0.1 <0.1     Impression and Plan:  75 year old man with:  1.  Castration-resistant advanced prostate cancer with disease to the bone diagnosed in 2018.   He continues to be on Xtandi with excellent PSA response.  Laboratory data from August 22 were reviewed and continues to show undetectable PSA.  Risks and benefits of continuing this treatment long-term.  Complications that include hypertension, fatigue among others were reiterated.  Different salvage therapy options predominantly systemic chemotherapy, PARP inhibitor if he harbors the appropriate mutation.   2. Androgen deprivation: He is currently on Eligard which will continue indefinitely.  Complications include hot flashes, weight gain among others were reviewed.   3. Bone directed therapy: I recommended calcium and vitamin D supplements.  4.  Age-appropriate cancer screening: He has completed a colonoscopy since the last visit.   5. Follow-up: She will return in 4 months for a follow-up visit.  30  minutes were dedicated to this encounter.  The time was spent on reviewing laboratory data, disease status update and addressing complications with cancer and cancer therapy.      Benjamin Button, MD 8/31/20229:25 AM

## 2021-08-22 NOTE — Patient Instructions (Signed)
Leuprolide depot injection What is this medication? LEUPROLIDE (loo PROE lide) is a man-made protein that acts like a natural hormone in the body. It decreases testosterone in men and decreases estrogen in women. In men, this medicine is used to treat advanced prostate cancer. In women, some forms of this medicine may be used to treat endometriosis, uterine fibroids, or other male hormone-related problems. This medicine may be used for other purposes; ask your health care provider or pharmacist if you have questions. COMMON BRAND NAME(S): Eligard, Fensolv, Lupron Depot, Lupron Depot-Ped, Viadur What should I tell my care team before I take this medication? They need to know if you have any of these conditions: diabetes heart disease or previous heart attack high blood pressure high cholesterol mental illness osteoporosis pain or difficulty passing urine seizures spinal cord metastasis stroke suicidal thoughts, plans, or attempt; a previous suicide attempt by you or a family member tobacco smoker unusual vaginal bleeding (women) an unusual or allergic reaction to leuprolide, benzyl alcohol, other medicines, foods, dyes, or preservatives pregnant or trying to get pregnant breast-feeding How should I use this medication? This medicine is for injection into a muscle or for injection under the skin. It is given by a health care professional in a hospital or clinic setting. The specific product will determine how it will be given to you. Make sure you understand which product you receive and how often you will receive it. Talk to your pediatrician regarding the use of this medicine in children. Special care may be needed. Overdosage: If you think you have taken too much of this medicine contact a poison control center or emergency room at once. NOTE: This medicine is only for you. Do not share this medicine with others. What if I miss a dose? It is important not to miss a dose. Call your  doctor or health care professional if you are unable to keep an appointment. Depot injections: Depot injections are given either once-monthly, every 12 weeks, every 16 weeks, or every 24 weeks depending on the product you are prescribed. The product you are prescribed will be based on if you are male or male, and your condition. Make sure you understand your product and dosing. What may interact with this medication? Do not take this medicine with any of the following medications: chasteberry cisapride dronedarone pimozide thioridazine This medicine may also interact with the following medications: herbal or dietary supplements, like black cohosh or DHEA male hormones, like estrogens or progestins and birth control pills, patches, rings, or injections male hormones, like testosterone other medicines that prolong the QT interval (abnormal heart rhythm) This list may not describe all possible interactions. Give your health care provider a list of all the medicines, herbs, non-prescription drugs, or dietary supplements you use. Also tell them if you smoke, drink alcohol, or use illegal drugs. Some items may interact with your medicine. What should I watch for while using this medication? Visit your doctor or health care professional for regular checks on your progress. During the first weeks of treatment, your symptoms may get worse, but then will improve as you continue your treatment. You may get hot flashes, increased bone pain, increased difficulty passing urine, or an aggravation of nerve symptoms. Discuss these effects with your doctor or health care professional, some of them may improve with continued use of this medicine. Male patients may experience a menstrual cycle or spotting during the first months of therapy with this medicine. If this continues, contact your doctor or  health care professional. This medicine may increase blood sugar. Ask your healthcare provider if changes in diet  or medicines are needed if you have diabetes. What side effects may I notice from receiving this medication? Side effects that you should report to your doctor or health care professional as soon as possible: allergic reactions like skin rash, itching or hives, swelling of the face, lips, or tongue breathing problems chest pain depression or memory disorders pain in your legs or groin pain at site where injected or implanted seizures severe headache signs and symptoms of high blood sugar such as being more thirsty or hungry or having to urinate more than normal. You may also feel very tired or have blurry vision swelling of the feet and legs suicidal thoughts or other mood changes visual changes vomiting Side effects that usually do not require medical attention (report to your doctor or health care professional if they continue or are bothersome): breast swelling or tenderness decrease in sex drive or performance diarrhea hot flashes loss of appetite muscle, joint, or bone pains nausea redness or irritation at site where injected or implanted skin problems or acne This list may not describe all possible side effects. Call your doctor for medical advice about side effects. You may report side effects to FDA at 1-800-FDA-1088. Where should I keep my medication? This drug is given in a hospital or clinic and will not be stored at home. NOTE: This sheet is a summary. It may not cover all possible information. If you have questions about this medicine, talk to your doctor, pharmacist, or health care provider.  2022 Elsevier/Gold Standard (2019-11-10 10:35:13)

## 2021-08-23 ENCOUNTER — Other Ambulatory Visit (HOSPITAL_COMMUNITY): Payer: Self-pay

## 2021-09-20 ENCOUNTER — Telehealth: Payer: Self-pay

## 2021-09-20 ENCOUNTER — Other Ambulatory Visit: Payer: Self-pay | Admitting: Oncology

## 2021-09-20 ENCOUNTER — Other Ambulatory Visit (HOSPITAL_COMMUNITY): Payer: Self-pay

## 2021-09-20 DIAGNOSIS — C61 Malignant neoplasm of prostate: Secondary | ICD-10-CM

## 2021-09-20 MED ORDER — ENZALUTAMIDE 40 MG PO TABS: ORAL_TABLET | ORAL | 0 refills | Status: DC

## 2021-09-20 MED ORDER — ENZALUTAMIDE 40 MG PO TABS
ORAL_TABLET | ORAL | 0 refills | Status: DC
Start: 2021-09-20 — End: 2021-09-20
  Filled 2021-09-20: qty 120, 30d supply, fill #0

## 2021-09-20 MED ORDER — ENZALUTAMIDE 40 MG PO TABS
ORAL_TABLET | ORAL | 0 refills | Status: DC
  Filled 2021-09-20: qty 120, 30d supply, fill #0

## 2021-09-20 NOTE — Telephone Encounter (Signed)
Telephone encounter opened in error.   Drema Halon, PharmD Hematology/Oncology Clinical Pharmacist [facility] [phone]

## 2021-09-21 ENCOUNTER — Other Ambulatory Visit (HOSPITAL_COMMUNITY): Payer: Self-pay

## 2021-09-25 ENCOUNTER — Other Ambulatory Visit (HOSPITAL_COMMUNITY): Payer: Self-pay

## 2021-10-12 IMAGING — CT CT ABD-PELV W/ CM
2 of 5 series · 15 of 46 positions shown, 17 images · IV contrast (OMNIPAQUE)
Comparison: 08/13/2019

CLINICAL DATA: Prostate cancer.  Restaging.

EXAM:
CT ABDOMEN AND PELVIS WITH CONTRAST
TECHNIQUE: Multidetector CT imaging of the abdomen and pelvis was performed
using the standard protocol following bolus administration of
intravenous contrast.
CONTRAST:  100mL OMNIPAQUE IOHEXOL 300 MG/ML  SOLN

[Series 2: axial st · axial · 0.86mm/px · z∈[-444,-4]mm · 12 of 104 slices shown, 14 images]
[im 8/104  soft-tissue]
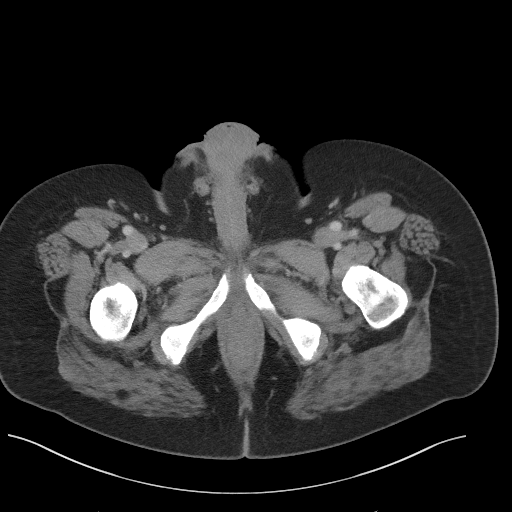
[im 8/104  bone]
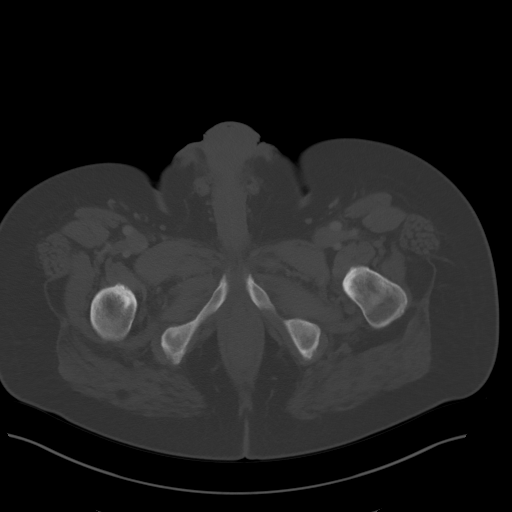
[im 15/104  soft-tissue]
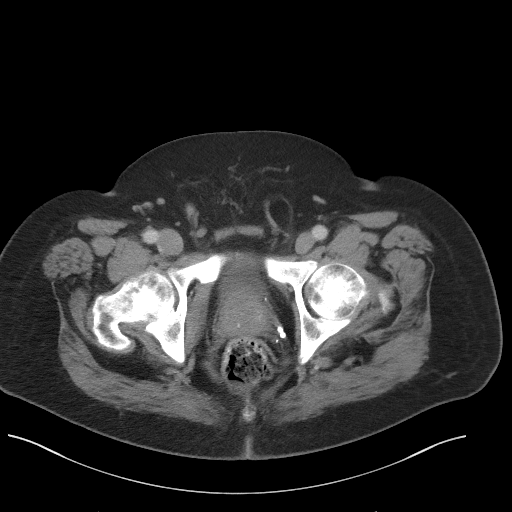
[im 23/104  soft-tissue]
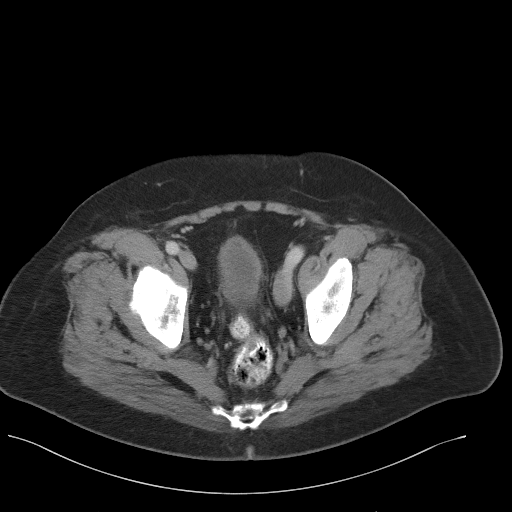
[im 30/104  soft-tissue]
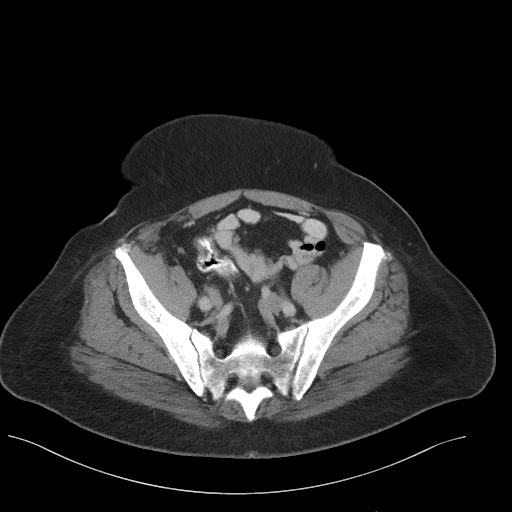
[im 37/104  soft-tissue]
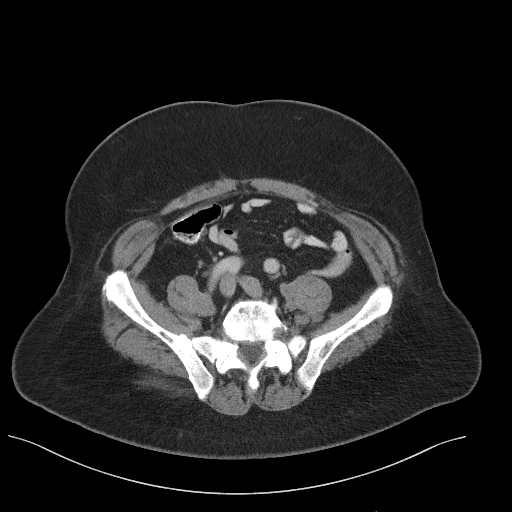
[im 45/104  soft-tissue]
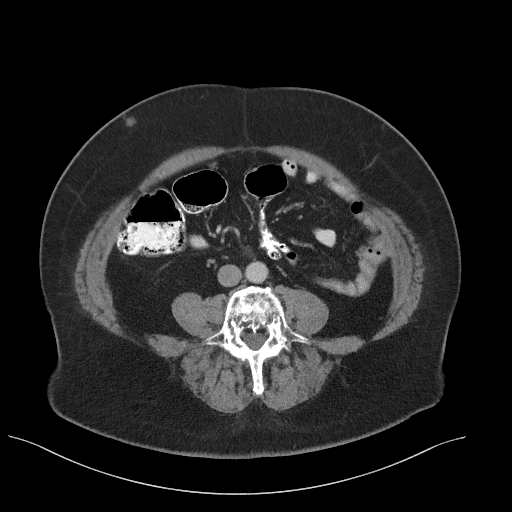
[im 59/104  soft-tissue]
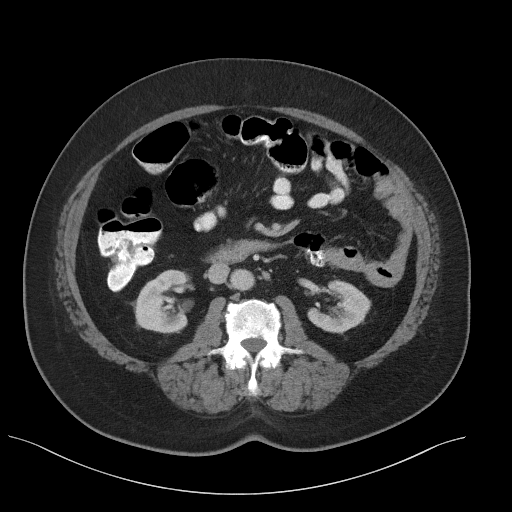
[im 67/104  soft-tissue]
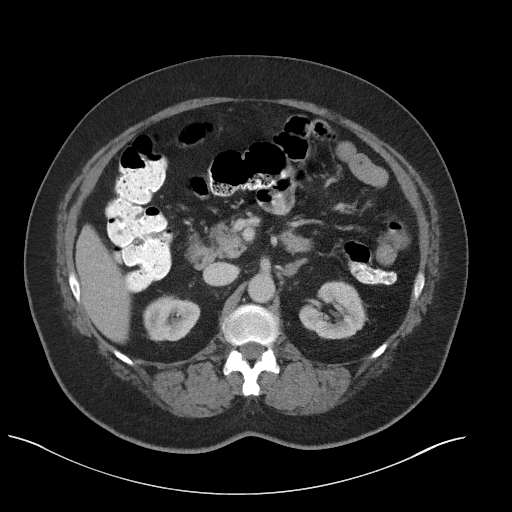
[im 74/104  soft-tissue]
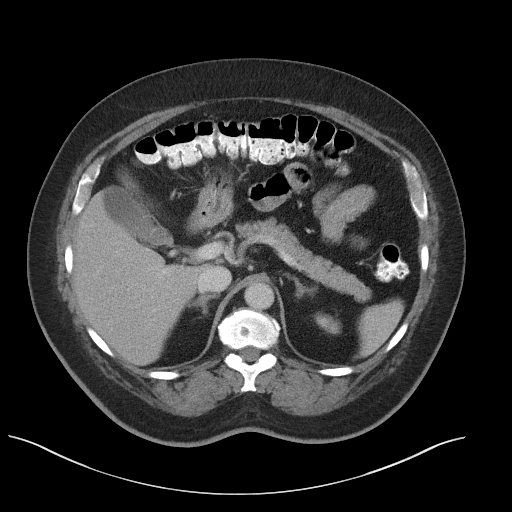
[im 74/104  bone]
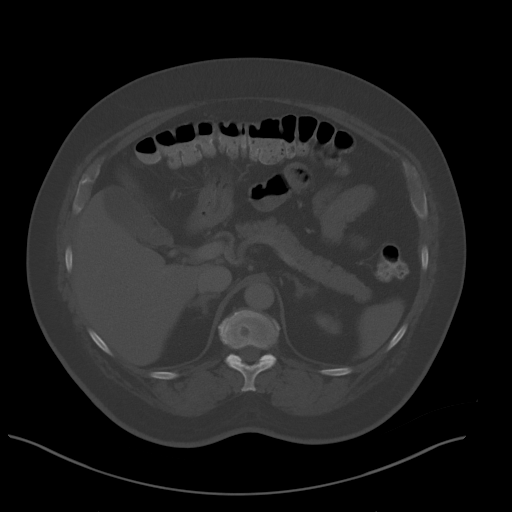
[im 81/104  soft-tissue]
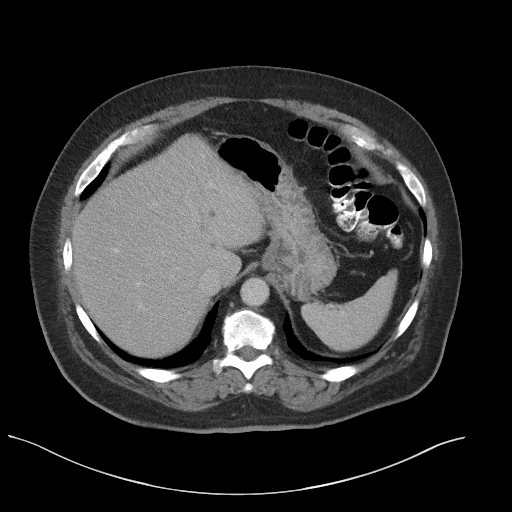
[im 89/104  soft-tissue]
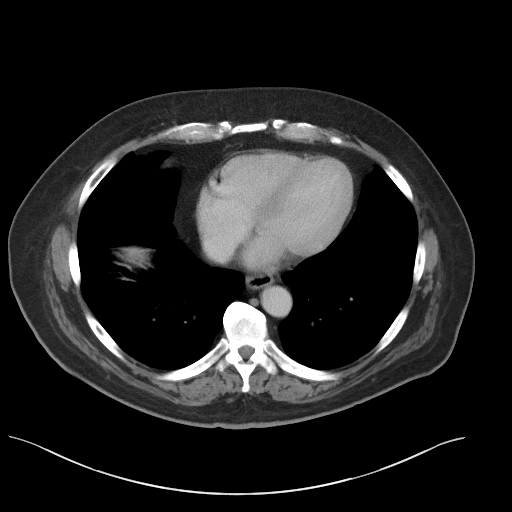
[im 96/104  soft-tissue]
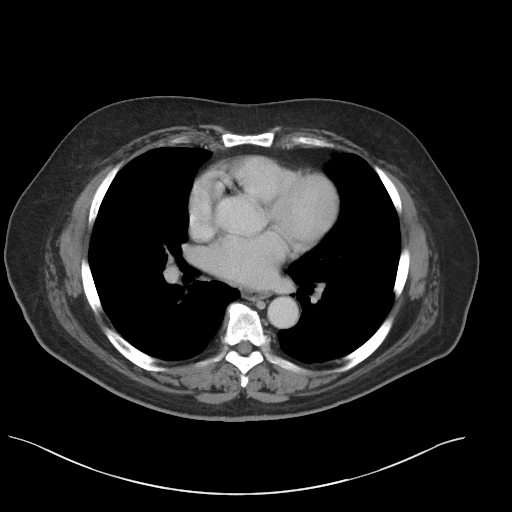

[Series 5: coronal st · coronal · 0.85mm/px · 3 of 117 slices shown]
[im 39/117  soft-tissue]
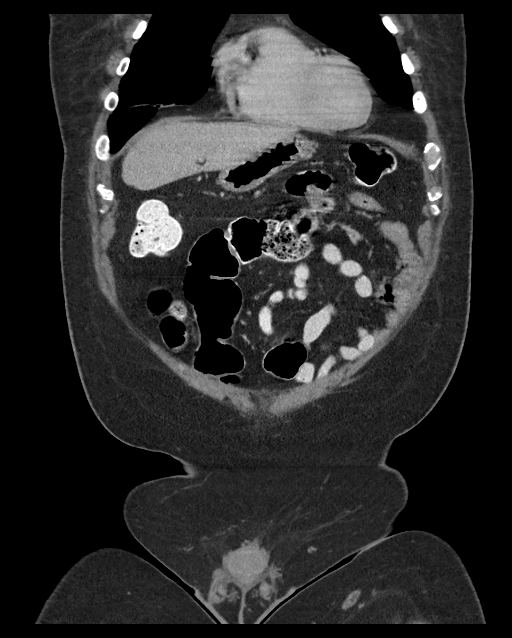
[im 52/117  soft-tissue]
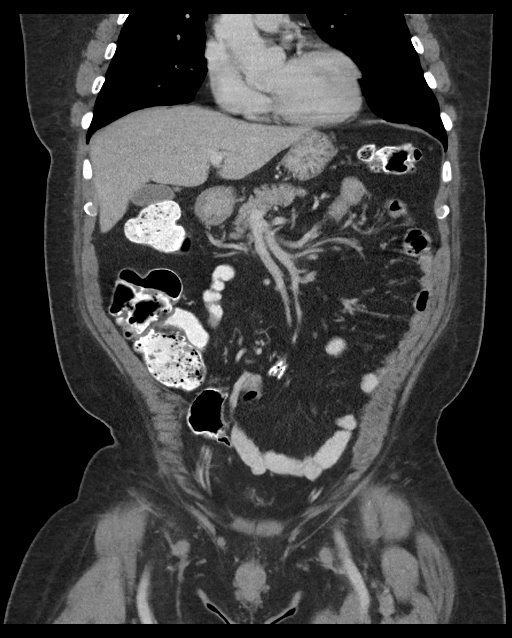
[im 65/117  soft-tissue]
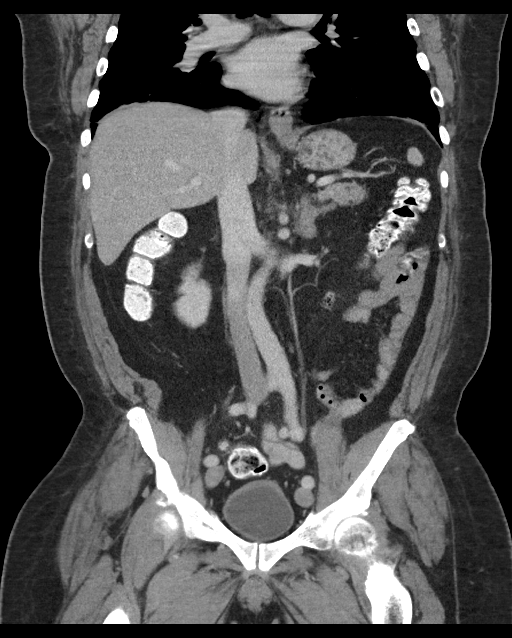

[15 of 46 positions shown; findings below may reference images not displayed]

FINDINGS: Lower chest: No acute abnormality.

Hepatobiliary: Unchanged 5 mm low-attenuation structure in left
hepatic lobe, image [DATE]. No suspicious liver abnormality identified
at this time. Gallbladder normal. No biliary dilatation.

Pancreas: Unremarkable. No pancreatic ductal dilatation or
surrounding inflammatory changes.

Spleen: Normal in size without focal abnormality.

Adrenals/Urinary Tract: Normal adrenal glands. Similar appearance of
right kidney cysts. Several, less than 1 cm and too small to
reliably characterize low-density foci are noted within the left
kidney. Also unchanged. No suspicious kidney mass or hydronephrosis
noted at this time.

Stomach/Bowel: Stomach appears normal. No bowel wall thickening,
inflammation or distension.

Vascular/Lymphatic: Normal appearance of the abdominal aorta. No
abdominopelvic adenopathy.

Reproductive: Prostate is unremarkable.

Other: No free fluid or fluid collections. Fat containing umbilical
hernia noted.

Musculoskeletal: Similar appearance of cortical thickening and
accentuation of the trabecular markings involving the L4 vertebra
which as mentioned previously is compatible with Paget's disease.
This does not have the typical appearance prostate cancer bone
metastases.

Again seen is a heterogeneous appearance of the trabecular markings.
Multifocal lucencies within the sacrum and bilateral iliac bones are
again noted and do not appear significantly changed from previous
exam dating back to 5594. This would be an atypical appearance of
prostate metastasis and based on MR findings from 08/20/2013 are
favored to represent focal areas of fatty replacement of normal bone
marrow.
IMPRESSION: 1. No findings of prostate cancer nodal metastasis or solid organ
metastasis. No evidence for bone metastasis.
2. Similar appearance of cortical thickening and accentuation of the
trabecular markings involving the L4 vertebra which as mentioned
previously is compatible with Paget's disease.
3. Multifocal lucencies within the sacrum and bilateral iliac bones
are again noted. In the setting of heterogeneous bone marrow these
are favored to represent areas of fatty replacement of normal bone
marrow.

## 2021-10-12 IMAGING — NM NM BONE WHOLE BODY
2 series · 2 of 2 positions shown · non-contrast
Comparison: CT Abdomen and Pelvis today. Whole-body bone scan
08/13/2019 and earlier.

CLINICAL DATA: 73-year-old male prostate cancer restaging.

EXAM:
NUCLEAR MEDICINE WHOLE BODY BONE SCAN
TECHNIQUE: Whole body anterior and posterior images were obtained approximately
3 hours after intravenous injection of radiopharmaceutical.
RADIOPHARMACEUTICALS:  21.1 mCi Uechnetium-TTm MDP IV

[Series 1: whole body · 2.66mm/px · 1 of 1 slices shown (1 of 2)]
[im 1/1]
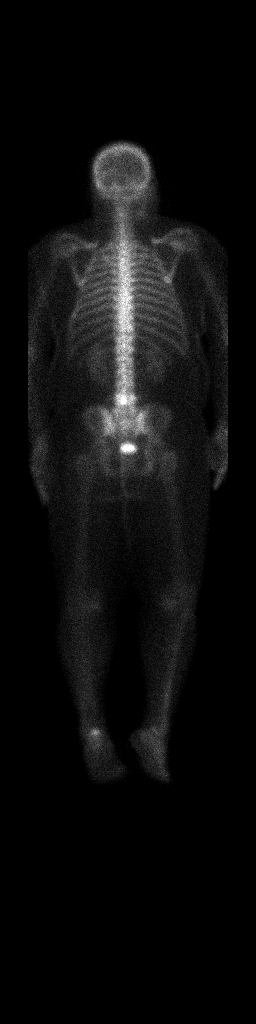

[Series 1: whole body · 2.66mm/px · 1 of 1 slices shown (2 of 2)]
[im 1/1]
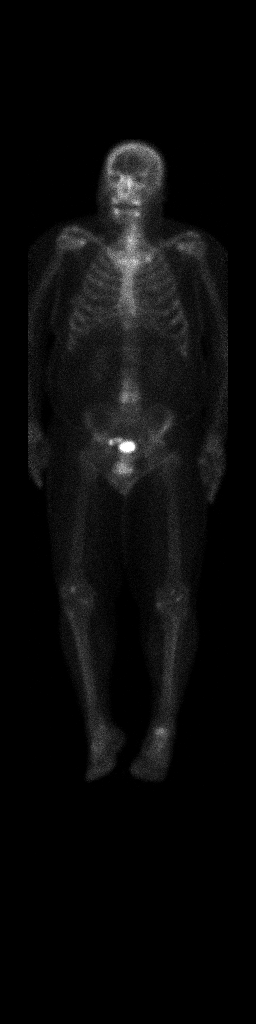

[2 of 2 positions shown; findings below may reference images not displayed]

FINDINGS: Expected radiotracer activity in the kidneys and urinary bladder.
Mild perineum contamination suspected.

Stable and homogeneous radiotracer activity in the skull and thorax.

In the lumbar spine chronic fairly homogeneously increased activity
in the L4 body is unchanged since 8327 and compatible with benign
Paget's disease. Elsewhere lumbosacral activity remains homogeneous.

Stable an homogeneous activity in the pelvis.

Stable scattered mild degenerative appearing activity in the visible
bilateral appendicular skeleton, including both shoulders, left
sternoclavicular joint, knees, feet.
IMPRESSION: 1. Stable bone scan, with no findings specific for metastatic
disease to bone.
2. Chronic Paget's disease of the L4 vertebra.

## 2021-10-18 ENCOUNTER — Telehealth: Payer: Self-pay

## 2021-10-18 ENCOUNTER — Other Ambulatory Visit (HOSPITAL_COMMUNITY): Payer: Self-pay

## 2021-10-18 NOTE — Telephone Encounter (Addendum)
Oral Chemotherapy Pharmacist Encounter   Patient has [employer] which can no longer be filled at [facility]. Further Xtandi prescriptions will need to be sent to [facility] in order to be filled.   Drema Halon, PharmD Hematology/Oncology Clinical Pharmacist [facility] [phone]

## 2021-10-25 ENCOUNTER — Telehealth: Payer: Self-pay | Admitting: *Deleted

## 2021-10-25 ENCOUNTER — Other Ambulatory Visit: Payer: Self-pay | Admitting: *Deleted

## 2021-10-25 ENCOUNTER — Other Ambulatory Visit (HOSPITAL_COMMUNITY): Payer: Self-pay

## 2021-10-25 DIAGNOSIS — C61 Malignant neoplasm of prostate: Secondary | ICD-10-CM

## 2021-10-25 MED ORDER — ENZALUTAMIDE 40 MG PO TABS: ORAL_TABLET | ORAL | 0 refills | Status: DC

## 2021-10-25 NOTE — Telephone Encounter (Signed)
PC to patient - informed him his [facility] rx will now be filled by [facility] instead of [facility] & that prescription was sent to [facility] today.  Phone number to [facility] given to patient in case he needs to contact them.  Patient verbalizes understanding.

## 2021-11-05 DIAGNOSIS — C61 Malignant neoplasm of prostate: Secondary | ICD-10-CM | POA: Diagnosis not present

## 2021-11-05 DIAGNOSIS — I10 Essential (primary) hypertension: Secondary | ICD-10-CM | POA: Diagnosis not present

## 2021-11-05 DIAGNOSIS — Z23 Encounter for immunization: Secondary | ICD-10-CM | POA: Diagnosis not present

## 2021-11-30 ENCOUNTER — Other Ambulatory Visit: Payer: Self-pay | Admitting: Oncology

## 2021-11-30 ENCOUNTER — Other Ambulatory Visit: Payer: Self-pay | Admitting: *Deleted

## 2021-11-30 DIAGNOSIS — C61 Malignant neoplasm of prostate: Secondary | ICD-10-CM

## 2021-11-30 MED ORDER — ENZALUTAMIDE 40 MG PO TABS: ORAL_TABLET | ORAL | 0 refills | Status: DC

## 2021-12-18 ENCOUNTER — Encounter: Payer: Self-pay | Admitting: Oncology

## 2021-12-18 ENCOUNTER — Other Ambulatory Visit: Payer: Self-pay

## 2021-12-18 ENCOUNTER — Inpatient Hospital Stay: Payer: Medicare Other | Attending: Oncology

## 2021-12-18 DIAGNOSIS — C7951 Secondary malignant neoplasm of bone: Secondary | ICD-10-CM | POA: Diagnosis not present

## 2021-12-18 DIAGNOSIS — C61 Malignant neoplasm of prostate: Secondary | ICD-10-CM | POA: Insufficient documentation

## 2021-12-18 LAB — CMP (CANCER CENTER ONLY)
ALT: 7 U/L (ref 0–44)
AST: 11 U/L — ABNORMAL LOW (ref 15–41)
Albumin: 3.7 g/dL (ref 3.5–5.0)
Alkaline Phosphatase: 125 U/L (ref 38–126)
Anion gap: 6 (ref 5–15)
BUN: 19 mg/dL (ref 8–23)
CO2: 27 mmol/L (ref 22–32)
Calcium: 9.1 mg/dL (ref 8.9–10.3)
Chloride: 106 mmol/L (ref 98–111)
Creatinine: 0.96 mg/dL (ref 0.61–1.24)
GFR, Estimated: >60 mL/min (ref >60)
Glucose, Bld: 96 mg/dL (ref 70–99)
Potassium: 4 mmol/L (ref 3.5–5.1)
Sodium: 139 mmol/L (ref 135–145)
Total Bilirubin: 0.7 mg/dL (ref 0.3–1.2)
Total Protein: 6.9 g/dL (ref 6.5–8.1)

## 2021-12-18 LAB — CBC WITH DIFFERENTIAL (CANCER CENTER ONLY)
Abs Immature Granulocytes: 0 10*3/uL (ref 0.00–0.07)
Basophils Absolute: 0 10*3/uL (ref 0.0–0.1)
Basophils Relative: 1 %
Eosinophils Absolute: 0.1 10*3/uL (ref 0.0–0.5)
Eosinophils Relative: 2 %
HCT: 40.5 % (ref 39.0–52.0)
Hemoglobin: 13.1 g/dL (ref 13.0–17.0)
Immature Granulocytes: 0 %
Lymphocytes Relative: 58 %
Lymphs Abs: 2.3 10*3/uL (ref 0.7–4.0)
MCH: 30 pg (ref 26.0–34.0)
MCHC: 32.3 g/dL (ref 30.0–36.0)
MCV: 92.9 fL (ref 80.0–100.0)
Monocytes Absolute: 0.2 10*3/uL (ref 0.1–1.0)
Monocytes Relative: 6 %
Neutro Abs: 1.3 10*3/uL — ABNORMAL LOW (ref 1.7–7.7)
Neutrophils Relative %: 33 %
Platelet Count: 195 10*3/uL (ref 150–400)
RBC: 4.36 MIL/uL (ref 4.22–5.81)
RDW: 12.8 % (ref 11.5–15.5)
WBC Count: 3.8 10*3/uL — ABNORMAL LOW (ref 4.0–10.5)
nRBC: 0 % (ref 0.0–0.2)

## 2021-12-19 LAB — PROSTATE-SPECIFIC AG, SERUM (LABCORP): Prostate Specific Ag, Serum: <0.1 ng/mL (ref 0.0–4.0)

## 2021-12-23 ENCOUNTER — Encounter: Payer: Self-pay | Admitting: Oncology

## 2021-12-25 ENCOUNTER — Inpatient Hospital Stay: Payer: Medicare Other | Attending: Oncology | Admitting: Oncology

## 2021-12-25 ENCOUNTER — Other Ambulatory Visit: Payer: Self-pay

## 2021-12-25 ENCOUNTER — Encounter: Payer: Self-pay | Admitting: Oncology

## 2021-12-25 ENCOUNTER — Other Ambulatory Visit: Payer: Self-pay | Admitting: *Deleted

## 2021-12-25 ENCOUNTER — Inpatient Hospital Stay: Payer: Medicare Other

## 2021-12-25 VITALS — BP 147/86 | HR 61 | Temp 97.2°F | Resp 17 | Wt 220.5 lb

## 2021-12-25 DIAGNOSIS — Z5111 Encounter for antineoplastic chemotherapy: Secondary | ICD-10-CM | POA: Insufficient documentation

## 2021-12-25 DIAGNOSIS — C61 Malignant neoplasm of prostate: Secondary | ICD-10-CM | POA: Diagnosis present

## 2021-12-25 DIAGNOSIS — C7951 Secondary malignant neoplasm of bone: Secondary | ICD-10-CM | POA: Insufficient documentation

## 2021-12-25 MED ORDER — LEUPROLIDE ACETATE (4 MONTH) 30 MG ~~LOC~~ KIT
30.0000 mg | PACK | Freq: Once | SUBCUTANEOUS | Status: AC
  Administered 2021-12-25: 30 mg via SUBCUTANEOUS
  Filled 2021-12-25: qty 30

## 2021-12-25 MED ORDER — ENZALUTAMIDE 40 MG PO TABS: ORAL_TABLET | ORAL | 0 refills | Status: DC

## 2021-12-25 NOTE — Progress Notes (Signed)
Hematology and Oncology Follow Up Visit  Benjamin Brown [id number] [birth year] 76 y.o. 12/25/2021 9:49 AM Benjamin Brown, MDHill, Berneta Sages, MD   Principle Diagnosis: 76 year old man with advanced prostate cancer diagnosed in 2018.  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    Current therapy:  Eligard 30 mg every 4 months.  Next injection will be given today.  Xtandi 160 mg daily started on October 11, 2017.  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  Since the last visit, he reports no major changes in his health.  He continues to tolerate Xtandi without any complaints.  He denies any nausea, vomiting or abdominal pain.  He denies any excessive fatigue or tiredness.  He denies any bone pain or pathological fractures.  He remains active and continues to attempt activities of daily living.        Medications: Updated on review. Current Outpatient Medications  Medication Sig Dispense Refill   enzalutamide (XTANDI) 40 MG tablet TAKE 4 TABLETS (160 MG TOTAL) BY MOUTH DAILY. 120 tablet 0   No current facility-administered medications for this visit.     Allergies: No Known Allergies     Physical Exam:     Blood pressure (!) 147/86, pulse 61, temperature (!) 97.2 F (36.2 C), temperature source Temporal, resp. rate 17, weight 220 lb 8 oz (100 kg), SpO2 98 %.      ECOG 0   General appearance: Comfortable appearing without any discomfort Head: Normocephalic without any trauma Oropharynx: Mucous membranes are moist and pink without any thrush or ulcers. Eyes: Pupils are equal and round reactive to light. Lymph nodes: No cervical, supraclavicular, inguinal or axillary lymphadenopathy.   Heart:regular rate and rhythm.  S1 and S2 without leg edema. Lung: Clear without any rhonchi or wheezes.  No dullness to percussion. Abdomin: Soft, nontender, nondistended with good bowel sounds.  No  hepatosplenomegaly. Musculoskeletal: No joint deformity or effusion.  Full range of motion noted. Neurological: No deficits noted on motor, sensory and deep tendon reflex exam. Skin: No petechial rash or dryness.  Appeared moist.                CBC    Component Value Date/Time   WBC 3.8 (L) 12/18/2021 1004   WBC 4.0 12/19/2017 1003   RBC 4.36 12/18/2021 1004   HGB 13.1 12/18/2021 1004   HGB 12.9 (L) 12/19/2017 1003   HCT 40.5 12/18/2021 1004   HCT 39.9 12/19/2017 1003   PLT 195 12/18/2021 1004   PLT 226 12/19/2017 1003   MCV 92.9 12/18/2021 1004   MCV 89.6 12/19/2017 1003   MCH 30.0 12/18/2021 1004   MCHC 32.3 12/18/2021 1004   RDW 12.8 12/18/2021 1004   RDW 14.6 12/19/2017 1003   LYMPHSABS 2.3 12/18/2021 1004   LYMPHSABS 1.9 12/19/2017 1003   MONOABS 0.2 12/18/2021 1004   MONOABS 0.3 12/19/2017 1003   EOSABS 0.1 12/18/2021 1004   EOSABS 0.1 12/19/2017 1003   BASOSABS 0.0 12/18/2021 1004   BASOSABS 0.0 12/19/2017 1003     Chemistry      Component Value Date/Time   NA 139 12/18/2021 1004   NA 142 12/19/2017 1003   K 4.0 12/18/2021 1004   K 4.6 12/19/2017 1003   CL 106 12/18/2021 1004   CO2 27 12/18/2021 1004   CO2 27 12/19/2017 1003   BUN 19 12/18/2021 1004   BUN 15.9 12/19/2017 1003  CREATININE 0.96 12/18/2021 1004   CREATININE 1.0 12/19/2017 1003      Component Value Date/Time   CALCIUM 9.1 12/18/2021 1004   CALCIUM 9.6 12/19/2017 1003   ALKPHOS 125 12/18/2021 1004   ALKPHOS 132 12/19/2017 1003   AST 11 (L) 12/18/2021 1004   AST 13 12/19/2017 1003   ALT 7 12/18/2021 1004   ALT 10 12/19/2017 1003   BILITOT 0.7 12/18/2021 1004   BILITOT 0.54 12/19/2017 1003       Latest Reference Range & Units 08/13/21 09:24 12/18/21 10:04  Prostate Specific Ag, Serum 0.0 - 4.0 ng/mL <0.1 <0.1      Impression and Plan:  76 year old man with:  1.  Advanced prostate cancer diagnosed in 2018 with disease to the bone.  He has castration-resistant  disease.  His disease status was updated at this time and treatment choices were reviewed.  His PSA continues to be undetectable on Xtandi without any complications.  Risks and benefits of continuing this treatment long-term were reviewed.  Potential complications that include hypertension, hematuria and rarely seizures were reiterated.  He is agreeable to continue at this time.  Alternative treatment options including systemic chemotherapy and other choices were reviewed.  2. Androgen deprivation: Risks and benefits of proceeding with Eligard today were discussed.  Complication clinic weight gain, hot flashes among others were reiterated.   3. Bone directed therapy: He is on calcium and vitamin D supplements recommended for the time being to continue.   4. Follow-up: In 4 months for repeat evaluation.  30  minutes were spent on this visit.  The time was dedicated to reviewing laboratory data, disease status update, treatment choices and future plan of care discussion.      Zola Button, MD 1/3/20239:49 AM

## 2021-12-25 NOTE — Telephone Encounter (Signed)
Received faxed refill request from [facility] pharmacy for [facility] Refilled per Dr. Hazeline Junker note from [place] 12/25/21

## 2021-12-31 ENCOUNTER — Telehealth: Payer: Self-pay | Admitting: *Deleted

## 2021-12-31 DIAGNOSIS — C61 Malignant neoplasm of prostate: Secondary | ICD-10-CM

## 2021-12-31 MED ORDER — ENZALUTAMIDE 40 MG PO TABS: ORAL_TABLET | ORAL | 0 refills | Status: DC

## 2021-12-31 NOTE — Telephone Encounter (Signed)
Benjamin Brown states he has received a letter stating he needs to get Xtandi filled  by [facility].

## 2022-01-03 ENCOUNTER — Telehealth: Payer: Self-pay | Admitting: Oncology

## 2022-01-03 NOTE — Telephone Encounter (Signed)
Sch per 1/4 inbasket, pt aware

## 2022-01-11 ENCOUNTER — Encounter: Payer: Self-pay | Admitting: Oncology

## 2022-01-13 ENCOUNTER — Other Ambulatory Visit: Payer: Self-pay | Admitting: Oncology

## 2022-01-13 DIAGNOSIS — C61 Malignant neoplasm of prostate: Secondary | ICD-10-CM

## 2022-01-14 ENCOUNTER — Encounter: Payer: Self-pay | Admitting: Oncology

## 2022-02-05 ENCOUNTER — Other Ambulatory Visit: Payer: Self-pay | Admitting: Oncology

## 2022-02-05 DIAGNOSIS — C61 Malignant neoplasm of prostate: Secondary | ICD-10-CM

## 2022-02-06 ENCOUNTER — Other Ambulatory Visit (HOSPITAL_COMMUNITY): Payer: Self-pay

## 2022-02-19 ENCOUNTER — Other Ambulatory Visit (HOSPITAL_COMMUNITY): Payer: Self-pay

## 2022-03-18 ENCOUNTER — Other Ambulatory Visit: Payer: Self-pay | Admitting: Oncology

## 2022-03-18 DIAGNOSIS — C61 Malignant neoplasm of prostate: Secondary | ICD-10-CM

## 2022-04-11 ENCOUNTER — Other Ambulatory Visit: Payer: Self-pay | Admitting: Oncology

## 2022-04-11 DIAGNOSIS — C61 Malignant neoplasm of prostate: Secondary | ICD-10-CM

## 2022-04-23 ENCOUNTER — Inpatient Hospital Stay: Payer: Medicare Other | Attending: Oncology

## 2022-04-23 ENCOUNTER — Inpatient Hospital Stay: Payer: Medicare Other

## 2022-04-23 ENCOUNTER — Other Ambulatory Visit: Payer: Self-pay

## 2022-04-23 ENCOUNTER — Inpatient Hospital Stay (HOSPITAL_BASED_OUTPATIENT_CLINIC_OR_DEPARTMENT_OTHER): Payer: Medicare Other | Admitting: Oncology

## 2022-04-23 VITALS — BP 145/79 | HR 72 | Temp 97.9°F | Resp 18 | Ht 65.0 in | Wt 218.4 lb

## 2022-04-23 DIAGNOSIS — C61 Malignant neoplasm of prostate: Secondary | ICD-10-CM

## 2022-04-23 DIAGNOSIS — C7951 Secondary malignant neoplasm of bone: Secondary | ICD-10-CM | POA: Insufficient documentation

## 2022-04-23 DIAGNOSIS — Z5111 Encounter for antineoplastic chemotherapy: Secondary | ICD-10-CM | POA: Insufficient documentation

## 2022-04-23 LAB — CMP (CANCER CENTER ONLY)
ALT: 8 U/L (ref 0–44)
AST: 12 U/L — ABNORMAL LOW (ref 15–41)
Albumin: 3.9 g/dL (ref 3.5–5.0)
Alkaline Phosphatase: 119 U/L (ref 38–126)
Anion gap: 3 — ABNORMAL LOW (ref 5–15)
BUN: 16 mg/dL (ref 8–23)
CO2: 29 mmol/L (ref 22–32)
Calcium: 9.3 mg/dL (ref 8.9–10.3)
Chloride: 106 mmol/L (ref 98–111)
Creatinine: 1.01 mg/dL (ref 0.61–1.24)
GFR, Estimated: >60 mL/min (ref >60)
Glucose, Bld: 100 mg/dL — ABNORMAL HIGH (ref 70–99)
Potassium: 4.4 mmol/L (ref 3.5–5.1)
Sodium: 138 mmol/L (ref 135–145)
Total Bilirubin: 1 mg/dL (ref 0.3–1.2)
Total Protein: 7.2 g/dL (ref 6.5–8.1)

## 2022-04-23 LAB — CBC WITH DIFFERENTIAL (CANCER CENTER ONLY)
Abs Immature Granulocytes: 0.01 10*3/uL (ref 0.00–0.07)
Basophils Absolute: 0 10*3/uL (ref 0.0–0.1)
Basophils Relative: 0 %
Eosinophils Absolute: 0.1 10*3/uL (ref 0.0–0.5)
Eosinophils Relative: 1 %
HCT: 39.1 % (ref 39.0–52.0)
Hemoglobin: 13 g/dL (ref 13.0–17.0)
Immature Granulocytes: 0 %
Lymphocytes Relative: 57 %
Lymphs Abs: 2.2 10*3/uL (ref 0.7–4.0)
MCH: 30.4 pg (ref 26.0–34.0)
MCHC: 33.2 g/dL (ref 30.0–36.0)
MCV: 91.6 fL (ref 80.0–100.0)
Monocytes Absolute: 0.3 10*3/uL (ref 0.1–1.0)
Monocytes Relative: 7 %
Neutro Abs: 1.4 10*3/uL — ABNORMAL LOW (ref 1.7–7.7)
Neutrophils Relative %: 35 %
Platelet Count: 193 10*3/uL (ref 150–400)
RBC: 4.27 MIL/uL (ref 4.22–5.81)
RDW: 13.5 % (ref 11.5–15.5)
WBC Count: 3.9 10*3/uL — ABNORMAL LOW (ref 4.0–10.5)
nRBC: 0 % (ref 0.0–0.2)

## 2022-04-23 MED ORDER — LEUPROLIDE ACETATE (4 MONTH) 30 MG ~~LOC~~ KIT
30.0000 mg | PACK | Freq: Once | SUBCUTANEOUS | Status: AC
  Administered 2022-04-23: 30 mg via SUBCUTANEOUS
  Filled 2022-04-23: qty 30

## 2022-04-23 NOTE — Progress Notes (Signed)
Hematology and Oncology Follow Up Visit ? ?MAXINE HUYNH ?[id number] ?1946-11-10 76 y.o. ?04/23/2022 10:17 AM ?[name], MDHill, Berneta Sages, MD  ? ?Principle Diagnosis: 76 year old man with castration-resistant advanced prostate cancer diagnosed in 2018.  He presented in 2014 with a Gleason score of 8 and a PSA of 71.9.   ? ?Current therapy:  ?Eligard 30 mg every 4 months.  He will receive the next injection today and repeated in 4 months. ? ?Xtandi 160 mg daily started on October 11, 2017. ? 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  Since last visit, he reports no major changes in his health.  He has tolerated Xtandi without any recent complaints.  He denies any nausea, vomiting or abdominal pain.  He denies any new bone pain or pathological fractures.  His performance status and quality of life remains unchanged.  He continues to attend to activities of daily living without any complaints.   He denies any excessive fatigue or falls. ? ? ? ? ? ?  ?Medications: Reviewed without changes. ?Current Outpatient Medications  ?Medication Sig Dispense Refill  ? XTANDI 40 MG tablet TAKE 4 TABLETS DAILY 120 tablet 0  ? ?No current facility-administered medications for this visit.  ? ? ? ?Allergies: No Known Allergies ? ? ? ? ?Physical Exam: ? ? ? ? ? ? ?Blood pressure (!) 145/79, pulse 72, temperature 97.9 ?F (36.6 ?C), temperature source Temporal, resp. rate 18, height '5\' 5"'$  (1.651 m), weight 218 lb 6.4 oz (99.1 kg), SpO2 98 %. ? ? ? ? ?ECOG 0 ? ? ? ?General appearance: Alert, awake without any distress. ?Head: Atraumatic without abnormalities ?Oropharynx: Without any thrush or ulcers. ?Eyes: No scleral icterus. ?Lymph nodes: No lymphadenopathy noted in the cervical, supraclavicular, or axillary nodes ?Heart:regular rate and rhythm, without any murmurs or gallops.   ?Lung: Clear to auscultation without any rhonchi, wheezes or dullness to percussion. ?Abdomin: Soft, nontender without any shifting dullness or  ascites. ?Musculoskeletal: No clubbing or cyanosis. ?Neurological: No motor or sensory deficits. ?Skin: No rashes or lesions. ? ? ? ? ? ? ? ? ? ? ? ? ? ? ? ?CBC ?   ?Component Value Date/Time  ? WBC 3.9 (L) 04/23/2022 1007  ? WBC 4.0 12/19/2017 1003  ? RBC 4.27 04/23/2022 1007  ? HGB 13.0 04/23/2022 1007  ? HGB 12.9 (L) 12/19/2017 1003  ? HCT 39.1 04/23/2022 1007  ? HCT 39.9 12/19/2017 1003  ? PLT 193 04/23/2022 1007  ? PLT 226 12/19/2017 1003  ? MCV 91.6 04/23/2022 1007  ? MCV 89.6 12/19/2017 1003  ? MCH 30.4 04/23/2022 1007  ? MCHC 33.2 04/23/2022 1007  ? RDW 13.5 04/23/2022 1007  ? RDW 14.6 12/19/2017 1003  ? LYMPHSABS 2.2 04/23/2022 1007  ? LYMPHSABS 1.9 12/19/2017 1003  ? MONOABS 0.3 04/23/2022 1007  ? MONOABS 0.3 12/19/2017 1003  ? EOSABS 0.1 04/23/2022 1007  ? EOSABS 0.1 12/19/2017 1003  ? BASOSABS 0.0 04/23/2022 1007  ? BASOSABS 0.0 12/19/2017 1003  ? ?  Chemistry   ?   ?Component Value Date/Time  ? NA 139 12/18/2021 1004  ? NA 142 12/19/2017 1003  ? K 4.0 12/18/2021 1004  ? K 4.6 12/19/2017 1003  ? CL 106 12/18/2021 1004  ? CO2 27 12/18/2021 1004  ? CO2 27 12/19/2017 1003  ? BUN 19 12/18/2021 1004  ? BUN 15.9 12/19/2017 1003  ? CREATININE 0.96 12/18/2021 1004  ? CREATININE 1.0 12/19/2017 1003  ?    ?  Component Value Date/Time  ? CALCIUM 9.1 12/18/2021 1004  ? CALCIUM 9.6 12/19/2017 1003  ? ALKPHOS 125 12/18/2021 1004  ? ALKPHOS 132 12/19/2017 1003  ? AST 11 (L) 12/18/2021 1004  ? AST 13 12/19/2017 1003  ? ALT 7 12/18/2021 1004  ? ALT 10 12/19/2017 1003  ? BILITOT 0.7 12/18/2021 1004  ? BILITOT 0.54 12/19/2017 1003  ?  ? ? ? ? ? Latest Reference Range & Units 08/13/21 09:24 12/18/21 10:04  ?Prostate Specific Ag, Serum 0.0 - 4.0 ng/mL <0.1 <0.1  ? ? ?Impression and Plan: ? ?76 year old man with: ? ?1.  Castration-resistant advanced prostate cancer diagnosed in 2018. ? ?He continues to be on Xtandi with reasonable response at this time and a PSA that is undetectable.  Risks and benefits of continuing this  treatment long-term were reviewed.  Potential complications including hypertension, fatigue and rarely seizures.  Alternative treatment options including Taxotere chemotherapy among others were reiterated.  He is agreeable to continue at this time. ? ?2. Androgen deprivation: He will receive Eligard today and repeated in 4 months.  Complication clinic weight gain, hot flashes among others were reviewed. ? ? ?3. Bone directed therapy: I recommended calcium and vitamin D supplements time being.  Bone directed therapy could be considered if he develops osteopenia.  DEXA scan will be considered in the future.  He will consider these options and let me know in the future. ? ?4.  Hypertension: His blood pressure is under reasonable control at this time.  We will continue to follow his primary care provider regarding this issue. ? ? ?5. Follow-up: He will return in 4 months for a follow-up evaluation. ? ?30  minutes were dedicated to this encounter.  The time was spent on reviewing laboratory data, disease status update, treatment choices and future plan of care discussion the location ? ? ? ? ? ?Zola Button, MD ?5/2/202310:17 AM ?

## 2022-04-24 ENCOUNTER — Telehealth: Payer: Self-pay | Admitting: *Deleted

## 2022-04-24 LAB — PROSTATE-SPECIFIC AG, SERUM (LABCORP): Prostate Specific Ag, Serum: <0.1 ng/mL (ref 0.0–4.0)

## 2022-04-24 NOTE — Telephone Encounter (Signed)
-----   Message from Wyatt Portela, MD sent at 04/24/2022  8:36 AM EDT ----- ?Please let him know his PSA is still low ?

## 2022-04-24 NOTE — Telephone Encounter (Signed)
PC to patient, informed him his PSA is <0.1, he verbalizes understanding. 

## 2022-05-14 ENCOUNTER — Other Ambulatory Visit: Payer: Self-pay | Admitting: Oncology

## 2022-05-14 DIAGNOSIS — C61 Malignant neoplasm of prostate: Secondary | ICD-10-CM

## 2022-06-09 ENCOUNTER — Other Ambulatory Visit: Payer: Self-pay | Admitting: Oncology

## 2022-06-09 DIAGNOSIS — C61 Malignant neoplasm of prostate: Secondary | ICD-10-CM

## 2022-06-10 ENCOUNTER — Encounter: Payer: Self-pay | Admitting: Oncology

## 2022-06-19 ENCOUNTER — Telehealth: Payer: Self-pay | Admitting: Oncology

## 2022-06-19 NOTE — Telephone Encounter (Signed)
Called patient regarding upcoming August and September appointments, patient has been called and notified. 

## 2022-07-09 ENCOUNTER — Other Ambulatory Visit: Payer: Self-pay | Admitting: Oncology

## 2022-07-09 DIAGNOSIS — C61 Malignant neoplasm of prostate: Secondary | ICD-10-CM

## 2022-08-06 ENCOUNTER — Other Ambulatory Visit: Payer: Self-pay | Admitting: Oncology

## 2022-08-06 DIAGNOSIS — C61 Malignant neoplasm of prostate: Secondary | ICD-10-CM

## 2022-08-20 ENCOUNTER — Inpatient Hospital Stay: Payer: Medicare Other | Attending: Oncology

## 2022-08-20 ENCOUNTER — Other Ambulatory Visit: Payer: Self-pay

## 2022-08-20 DIAGNOSIS — C61 Malignant neoplasm of prostate: Secondary | ICD-10-CM | POA: Diagnosis not present

## 2022-08-20 LAB — CMP (CANCER CENTER ONLY)
ALT: 8 U/L (ref 0–44)
AST: 17 U/L (ref 15–41)
Albumin: 3.7 g/dL (ref 3.5–5.0)
Alkaline Phosphatase: 104 U/L (ref 38–126)
Anion gap: 5 (ref 5–15)
BUN: 13 mg/dL (ref 8–23)
CO2: 27 mmol/L (ref 22–32)
Calcium: 9.2 mg/dL (ref 8.9–10.3)
Chloride: 107 mmol/L (ref 98–111)
Creatinine: 0.86 mg/dL (ref 0.61–1.24)
GFR, Estimated: >60 mL/min (ref >60)
Glucose, Bld: 76 mg/dL (ref 70–99)
Potassium: 3.7 mmol/L (ref 3.5–5.1)
Sodium: 139 mmol/L (ref 135–145)
Total Bilirubin: 1.2 mg/dL (ref 0.3–1.2)
Total Protein: 6.7 g/dL (ref 6.5–8.1)

## 2022-08-20 LAB — CBC WITH DIFFERENTIAL (CANCER CENTER ONLY)
Abs Immature Granulocytes: 0 10*3/uL (ref 0.00–0.07)
Basophils Absolute: 0 10*3/uL (ref 0.0–0.1)
Basophils Relative: 1 %
Eosinophils Absolute: 0.1 10*3/uL (ref 0.0–0.5)
Eosinophils Relative: 2 %
HCT: 35.6 % — ABNORMAL LOW (ref 39.0–52.0)
Hemoglobin: 12 g/dL — ABNORMAL LOW (ref 13.0–17.0)
Immature Granulocytes: 0 %
Lymphocytes Relative: 53 %
Lymphs Abs: 2 10*3/uL (ref 0.7–4.0)
MCH: 31.4 pg (ref 26.0–34.0)
MCHC: 33.7 g/dL (ref 30.0–36.0)
MCV: 93.2 fL (ref 80.0–100.0)
Monocytes Absolute: 0.4 10*3/uL (ref 0.1–1.0)
Monocytes Relative: 10 %
Neutro Abs: 1.2 10*3/uL — ABNORMAL LOW (ref 1.7–7.7)
Neutrophils Relative %: 34 %
Platelet Count: 190 10*3/uL (ref 150–400)
RBC: 3.82 MIL/uL — ABNORMAL LOW (ref 4.22–5.81)
RDW: 13.8 % (ref 11.5–15.5)
WBC Count: 3.6 10*3/uL — ABNORMAL LOW (ref 4.0–10.5)
nRBC: 0 % (ref 0.0–0.2)

## 2022-08-21 LAB — PROSTATE-SPECIFIC AG, SERUM (LABCORP): Prostate Specific Ag, Serum: <0.1 ng/mL (ref 0.0–4.0)

## 2022-08-27 ENCOUNTER — Inpatient Hospital Stay: Payer: Medicare Other

## 2022-08-27 ENCOUNTER — Other Ambulatory Visit: Payer: Self-pay

## 2022-08-27 ENCOUNTER — Inpatient Hospital Stay: Payer: Medicare Other | Attending: Oncology | Admitting: Oncology

## 2022-08-27 VITALS — BP 139/75 | HR 66 | Temp 98.1°F | Resp 17 | Ht 65.0 in | Wt 213.6 lb

## 2022-08-27 DIAGNOSIS — C61 Malignant neoplasm of prostate: Secondary | ICD-10-CM | POA: Diagnosis present

## 2022-08-27 DIAGNOSIS — D649 Anemia, unspecified: Secondary | ICD-10-CM | POA: Diagnosis not present

## 2022-08-27 DIAGNOSIS — Z5111 Encounter for antineoplastic chemotherapy: Secondary | ICD-10-CM | POA: Insufficient documentation

## 2022-08-27 MED ORDER — LEUPROLIDE ACETATE (4 MONTH) 30 MG ~~LOC~~ KIT
30.0000 mg | PACK | Freq: Once | SUBCUTANEOUS | Status: AC
  Administered 2022-08-27: 30 mg via SUBCUTANEOUS
  Filled 2022-08-27: qty 30

## 2022-08-27 NOTE — Progress Notes (Signed)
Hematology and Oncology Follow Up Visit  Benjamin Brown [id number] Apr 11, 1946 76 y.o. 08/27/2022 9:40 AM Benjamin Brown, MDHill, Berneta Sages, MD   Principle Diagnosis: 76 year old man with advanced prostate cancer with bone involvement diagnosed in 2018.  He has castration-resistant after initial diagnosis in 2014 with a Gleason score of 8 and a PSA of 71.9.    Current therapy:  Eligard 30 mg every 4 months.  Next injection will be given on 08/27/2022.  Xtandi 160 mg daily started on October 11, 2017.  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  Since last visit, he reports no major changes in his health.  He has tolerated Xtandi without any complaints.  He denies any nausea, fatigue or worsening bone pain.  He denies any falls or syncope.  Form status quality of life remains unchanged.  He is intentionally trying to lose weight with dieting and exercise.        Medications: Updated on review. Current Outpatient Medications  Medication Sig Dispense Refill   XTANDI 40 MG tablet TAKE 4 TABLETS DAILY 120 tablet 0   No current facility-administered medications for this visit.     Allergies: No Known Allergies     Physical Exam:    Blood pressure 139/75, pulse 66, temperature 98.1 F (36.7 C), temperature source Temporal, resp. rate 17, height '5\' 5"'$  (1.651 m), weight 213 lb 9.6 oz (96.9 kg), SpO2 96 %.         ECOG 0   General appearance: Comfortable appearing without any discomfort Head: Normocephalic without any trauma Oropharynx: Mucous membranes are moist and pink without any thrush or ulcers. Eyes: Pupils are equal and round reactive to light. Lymph nodes: No cervical, supraclavicular, inguinal or axillary lymphadenopathy.   Heart:regular rate and rhythm.  S1 and S2 without leg edema. Lung: Clear without any rhonchi or wheezes.  No dullness to percussion. Abdomin: Soft, nontender, nondistended with good bowel sounds.  No  hepatosplenomegaly. Musculoskeletal: No joint deformity or effusion.  Full range of motion noted. Neurological: No deficits noted on motor, sensory and deep tendon reflex exam. Skin: No petechial rash or dryness.  Appeared moist.                  CBC    Component Value Date/Time   WBC 3.6 (L) 08/20/2022 0946   WBC 4.0 12/19/2017 1003   RBC 3.82 (L) 08/20/2022 0946   HGB 12.0 (L) 08/20/2022 0946   HGB 12.9 (L) 12/19/2017 1003   HCT 35.6 (L) 08/20/2022 0946   HCT 39.9 12/19/2017 1003   PLT 190 08/20/2022 0946   PLT 226 12/19/2017 1003   MCV 93.2 08/20/2022 0946   MCV 89.6 12/19/2017 1003   MCH 31.4 08/20/2022 0946   MCHC 33.7 08/20/2022 0946   RDW 13.8 08/20/2022 0946   RDW 14.6 12/19/2017 1003   LYMPHSABS 2.0 08/20/2022 0946   LYMPHSABS 1.9 12/19/2017 1003   MONOABS 0.4 08/20/2022 0946   MONOABS 0.3 12/19/2017 1003   EOSABS 0.1 08/20/2022 0946   EOSABS 0.1 12/19/2017 1003   BASOSABS 0.0 08/20/2022 0946   BASOSABS 0.0 12/19/2017 1003     Chemistry      Component Value Date/Time   NA 139 08/20/2022 0946   NA 142 12/19/2017 1003   K 3.7 08/20/2022 0946   K 4.6 12/19/2017 1003   CL 107 08/20/2022 0946   CO2 27 08/20/2022 0946   CO2 27 12/19/2017 1003   BUN 13 08/20/2022 0946  BUN 15.9 12/19/2017 1003   CREATININE 0.86 08/20/2022 0946   CREATININE 1.0 12/19/2017 1003      Component Value Date/Time   CALCIUM 9.2 08/20/2022 0946   CALCIUM 9.6 12/19/2017 1003   ALKPHOS 104 08/20/2022 0946   ALKPHOS 132 12/19/2017 1003   AST 17 08/20/2022 0946   AST 13 12/19/2017 1003   ALT 8 08/20/2022 0946   ALT 10 12/19/2017 1003   BILITOT 1.2 08/20/2022 0946   BILITOT 0.54 12/19/2017 1003       Latest Reference Range & Units 12/18/21 10:04 04/23/22 10:07 08/20/22 09:46  Prostate Specific Ag, Serum 0.0 - 4.0 ng/mL <0.1 <0.1 <0.1      Impression and Plan:  76 year old man with:  1.  Advanced prostate cancer with biochemical relapse noted in 2018.  He has  castration-resistant disease.  His PSA continues to be undetectable on Xtandi.  Risks and benefits of continuing this treatment were discussed at this time.  Complications that include nausea, fatigue, hypertension and GI toxicity.  Alternative treatment options including Taxotere chemotherapy, PARP inhibitor patient among others.  He is agreeable to continue at this time.   2. Androgen deprivation: I recommended continuing Eligard indefinitely.  Complications including weight gain, hot flashes and sexual dysfunction.  He will receive next injection today.   3. Bone directed therapy: He is at risk for osteoporosis given his long-term androgen deprivation.  Current calcium and vitamin D supplements.  Recommend obtaining DEXA scan for the next visit.  Risks and benefits of Prolia were discussed.  Osteonecrosis of the jaw and hypocalcemia makes the most concerning.  We will obtain a DEXA scan before the next visit.  4.  Hypertension: His blood pressure is under control at this time.  5.  Anemia: His hemoglobin is slightly below baseline but overall mild.  We will obtain anemia work-up before the next visit.   6. Follow-up: In 4 months for a follow-up.  30  minutes were spent on this visit.  The time was dedicated to reviewing laboratory data, disease status update outlining future plan of care discussion.      Zola Button, MD 9/5/20239:40 AM

## 2022-09-09 ENCOUNTER — Other Ambulatory Visit: Payer: Self-pay | Admitting: Oncology

## 2022-09-09 DIAGNOSIS — C61 Malignant neoplasm of prostate: Secondary | ICD-10-CM

## 2022-10-08 ENCOUNTER — Other Ambulatory Visit: Payer: Self-pay | Admitting: Oncology

## 2022-10-08 DIAGNOSIS — C61 Malignant neoplasm of prostate: Secondary | ICD-10-CM

## 2022-11-11 ENCOUNTER — Other Ambulatory Visit: Payer: Self-pay | Admitting: Oncology

## 2022-11-11 DIAGNOSIS — C61 Malignant neoplasm of prostate: Secondary | ICD-10-CM

## 2022-11-26 ENCOUNTER — Ambulatory Visit (HOSPITAL_COMMUNITY)
Admission: RE | Admit: 2022-11-26 | Discharge: 2022-11-26 | Disposition: A | Discharge status: Home / Self Care | Payer: Medicare Other | Source: Ambulatory Visit | Attending: Oncology | Admitting: Oncology

## 2022-11-26 DIAGNOSIS — Z85830 Personal history of malignant neoplasm of bone: Secondary | ICD-10-CM | POA: Insufficient documentation

## 2022-11-26 DIAGNOSIS — M47816 Spondylosis without myelopathy or radiculopathy, lumbar region: Secondary | ICD-10-CM | POA: Insufficient documentation

## 2022-11-26 DIAGNOSIS — Z79890 Hormone replacement therapy (postmenopausal): Secondary | ICD-10-CM | POA: Diagnosis not present

## 2022-11-26 DIAGNOSIS — C61 Malignant neoplasm of prostate: Secondary | ICD-10-CM | POA: Insufficient documentation

## 2022-11-26 DIAGNOSIS — Z13820 Encounter for screening for osteoporosis: Secondary | ICD-10-CM | POA: Diagnosis not present

## 2022-11-26 DIAGNOSIS — M818 Other osteoporosis without current pathological fracture: Secondary | ICD-10-CM | POA: Insufficient documentation

## 2022-12-09 ENCOUNTER — Other Ambulatory Visit: Payer: Self-pay | Admitting: Oncology

## 2022-12-09 DIAGNOSIS — C61 Malignant neoplasm of prostate: Secondary | ICD-10-CM

## 2022-12-24 ENCOUNTER — Inpatient Hospital Stay: Payer: Medicare Other | Attending: Nurse Practitioner

## 2022-12-24 ENCOUNTER — Other Ambulatory Visit: Payer: Self-pay

## 2022-12-24 DIAGNOSIS — C7951 Secondary malignant neoplasm of bone: Secondary | ICD-10-CM | POA: Insufficient documentation

## 2022-12-24 DIAGNOSIS — C61 Malignant neoplasm of prostate: Secondary | ICD-10-CM | POA: Diagnosis present

## 2022-12-24 DIAGNOSIS — D649 Anemia, unspecified: Secondary | ICD-10-CM

## 2022-12-24 LAB — IRON AND IRON BINDING CAPACITY (CC-WL,HP ONLY)
Iron: 72 ug/dL (ref 45–182)
Saturation Ratios: 23 % (ref 17.9–39.5)
TIBC: 315 ug/dL (ref 250–450)
UIBC: 243 ug/dL (ref 117–376)

## 2022-12-24 LAB — CBC WITH DIFFERENTIAL (CANCER CENTER ONLY)
Abs Immature Granulocytes: 0.01 10*3/uL (ref 0.00–0.07)
Basophils Absolute: 0 10*3/uL (ref 0.0–0.1)
Basophils Relative: 0 %
Eosinophils Absolute: 0.1 10*3/uL (ref 0.0–0.5)
Eosinophils Relative: 1 %
HCT: 40.7 % (ref 39.0–52.0)
Hemoglobin: 13.6 g/dL (ref 13.0–17.0)
Immature Granulocytes: 0 %
Lymphocytes Relative: 49 %
Lymphs Abs: 2.2 10*3/uL (ref 0.7–4.0)
MCH: 31.3 pg (ref 26.0–34.0)
MCHC: 33.4 g/dL (ref 30.0–36.0)
MCV: 93.8 fL (ref 80.0–100.0)
Monocytes Absolute: 0.4 10*3/uL (ref 0.1–1.0)
Monocytes Relative: 8 %
Neutro Abs: 1.9 10*3/uL (ref 1.7–7.7)
Neutrophils Relative %: 42 %
Platelet Count: 202 10*3/uL (ref 150–400)
RBC: 4.34 MIL/uL (ref 4.22–5.81)
RDW: 12.8 % (ref 11.5–15.5)
WBC Count: 4.6 10*3/uL (ref 4.0–10.5)
nRBC: 0 % (ref 0.0–0.2)

## 2022-12-24 LAB — CMP (CANCER CENTER ONLY)
ALT: 10 U/L (ref 0–44)
AST: 14 U/L — ABNORMAL LOW (ref 15–41)
Albumin: 3.9 g/dL (ref 3.5–5.0)
Alkaline Phosphatase: 108 U/L (ref 38–126)
Anion gap: 6 (ref 5–15)
BUN: 16 mg/dL (ref 8–23)
CO2: 29 mmol/L (ref 22–32)
Calcium: 9.3 mg/dL (ref 8.9–10.3)
Chloride: 106 mmol/L (ref 98–111)
Creatinine: 0.9 mg/dL (ref 0.61–1.24)
GFR, Estimated: >60 mL/min (ref >60)
Glucose, Bld: 85 mg/dL (ref 70–99)
Potassium: 3.6 mmol/L (ref 3.5–5.1)
Sodium: 141 mmol/L (ref 135–145)
Total Bilirubin: 0.7 mg/dL (ref 0.3–1.2)
Total Protein: 7.5 g/dL (ref 6.5–8.1)

## 2022-12-24 LAB — FERRITIN: Ferritin: 88 ng/mL (ref 24–336)

## 2022-12-24 LAB — VITAMIN B12: Vitamin B-12: 482 pg/mL (ref 180–914)

## 2022-12-25 LAB — ERYTHROPOIETIN: Erythropoietin: 10.3 m[IU]/mL (ref 2.6–18.5)

## 2022-12-25 LAB — PROSTATE-SPECIFIC AG, SERUM (LABCORP): Prostate Specific Ag, Serum: <0.1 ng/mL (ref 0.0–4.0)

## 2022-12-31 ENCOUNTER — Inpatient Hospital Stay (HOSPITAL_BASED_OUTPATIENT_CLINIC_OR_DEPARTMENT_OTHER): Payer: Medicare Other | Admitting: Oncology

## 2022-12-31 ENCOUNTER — Inpatient Hospital Stay: Payer: Medicare Other

## 2022-12-31 ENCOUNTER — Other Ambulatory Visit: Payer: Self-pay

## 2022-12-31 VITALS — BP 138/81 | HR 73 | Temp 98.0°F | Resp 18 | Ht 65.0 in | Wt 213.7 lb

## 2022-12-31 DIAGNOSIS — C61 Malignant neoplasm of prostate: Secondary | ICD-10-CM

## 2022-12-31 DIAGNOSIS — C7951 Secondary malignant neoplasm of bone: Secondary | ICD-10-CM | POA: Diagnosis not present

## 2022-12-31 MED ORDER — LEUPROLIDE ACETATE (4 MONTH) 30 MG ~~LOC~~ KIT
30.0000 mg | PACK | Freq: Once | SUBCUTANEOUS | Status: AC
  Administered 2022-12-31: 30 mg via SUBCUTANEOUS
  Filled 2022-12-31: qty 30

## 2022-12-31 NOTE — Progress Notes (Signed)
Hematology and Oncology Follow Up Visit  Benjamin Brown [id number] 04-Jul-1946 77 y.o. 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  He presented with a PSA of 71.9, Gleason score of 8 in 2014.  Current therapy:  Eligard 30 mg every 4 months.  This will be repeated today and every 4 months.  Xtandi 160 mg daily started on October 11, 2017.  Interim History:  Benjamin Brown returns today for a follow-up.  Since the last visit, he reports no major changes in his health.  He denies any nausea, vomiting or abdominal pain.  He denies any complications related to [facility].  He denies any excessive fatigue, tiredness or bone pain.  He denies any pathological fractures.  He does report occasional nighttime cough associated with postnasal drip but no shortness of breath or difficulty breathing.       Medications: Reviewed without changes. Current Outpatient Medications  Medication Sig Dispense Refill   XTANDI 40 MG tablet TAKE 4 TABLETS DAILY 120 tablet 0   No current facility-administered medications for this visit.     Allergies: No Known Allergies     Physical Exam:        Blood pressure 138/81, pulse 73, temperature 98 F (36.7 C), temperature source Temporal, resp. rate 18, height '5\' 5"'$  (1.651 m), weight 213 lb 11.2 oz (96.9 kg), SpO2 99 %.      ECOG 0     General appearance: Alert, awake without any distress. Head: Atraumatic without abnormalities Oropharynx: Without any thrush or ulcers. Eyes: No scleral icterus. Lymph nodes: No lymphadenopathy noted in the cervical, supraclavicular, or axillary nodes Heart:regular rate and rhythm, without any murmurs or gallops.   Lung: Clear to auscultation without any rhonchi, wheezes or dullness to percussion. Abdomin: Soft, nontender without any shifting dullness or ascites. Musculoskeletal:  No clubbing or cyanosis. Neurological: No motor or sensory deficits. Skin: No rashes or lesions.                   CBC    Component Value Date/Time   WBC 4.6 12/24/2022 1321   WBC 4.0 12/19/2017 1003   RBC 4.34 12/24/2022 1321   HGB 13.6 12/24/2022 1321   HGB 12.9 (L) 12/19/2017 1003   HCT 40.7 12/24/2022 1321   HCT 39.9 12/19/2017 1003   PLT 202 12/24/2022 1321   PLT 226 12/19/2017 1003   MCV 93.8 12/24/2022 1321   MCV 89.6 12/19/2017 1003   MCH 31.3 12/24/2022 1321   MCHC 33.4 12/24/2022 1321   RDW 12.8 12/24/2022 1321   RDW 14.6 12/19/2017 1003   LYMPHSABS 2.2 12/24/2022 1321   LYMPHSABS 1.9 12/19/2017 1003   MONOABS 0.4 12/24/2022 1321   MONOABS 0.3 12/19/2017 1003   EOSABS 0.1 12/24/2022 1321   EOSABS 0.1 12/19/2017 1003   BASOSABS 0.0 12/24/2022 1321   BASOSABS 0.0 12/19/2017 1003     Chemistry      Component Value Date/Time   NA 141 12/24/2022 1321   NA 142 12/19/2017 1003   K 3.6 12/24/2022 1321   K 4.6 12/19/2017 1003   CL 106 12/24/2022 1321   CO2 29 12/24/2022 1321   CO2 27 12/19/2017 1003   BUN 16 12/24/2022 1321   BUN 15.9 12/19/2017 1003   CREATININE 0.90 12/24/2022 1321   CREATININE 1.0 12/19/2017 1003      Component Value Date/Time   CALCIUM 9.3 12/24/2022  1321   CALCIUM 9.6 12/19/2017 1003   ALKPHOS 108 12/24/2022 1321   ALKPHOS 132 12/19/2017 1003   AST 14 (L) 12/24/2022 1321   AST 13 12/19/2017 1003   ALT 10 12/24/2022 1321   ALT 10 12/19/2017 1003   BILITOT 0.7 12/24/2022 1321   BILITOT 0.54 12/19/2017 1003       Latest Reference Range & Units 04/23/22 10:07 08/20/22 09:46 12/24/22 13:21  Prostate Specific Ag, Serum 0.0 - 4.0 ng/mL <0.1 <0.1 <0.1     Impression and Plan:  77 year old man with:  1.  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  He initially presented in 2014 with localized disease.  His disease status was updated at this time and his PSA remains undetectable.   He continues to tolerate Xtandi without any major complications and he is willing to continue.  Long-term issues including nausea, fatigue, hematuria and rarely seizures were reiterated.  He is not experiencing any at this time.  Alternative treatment options such as Taxotere chemotherapy, PARP inhibitor (if he harbors the appropriate mutation) could be considered if he has progression of disease.   2. Androgen deprivation: This will be continued today and every 4 months and indefinitely.  Complications including weight gain, hot flashes and osteoporosis were reiterated.   3. Bone directed therapy: He is bone density evaluation in December 2023 did not show any evidence of osteopenia or osteoporosis.  I recommended continuing calcium and vitamin D supplements for now.   4.  Hypertension: This needs to be monitored on Xtandi.  His blood pressure remains under reasonable control.  5.  Anemia: His workup is unrevealing at this time with normalization of his hemoglobin.  No further evaluation is recommended.   6. Follow-up: He will return in 4 months for a follow-up.  30  minutes were dedicated to this encounter.  The time was spent on updating disease status, treatment choices and outlining future plan of care review.      Zola Button, MD 1/9/20249:52 AM

## 2023-01-06 ENCOUNTER — Other Ambulatory Visit: Payer: Self-pay | Admitting: Oncology

## 2023-01-06 DIAGNOSIS — C61 Malignant neoplasm of prostate: Secondary | ICD-10-CM

## 2023-01-10 ENCOUNTER — Other Ambulatory Visit: Payer: Self-pay

## 2023-02-11 ENCOUNTER — Other Ambulatory Visit: Payer: Self-pay

## 2023-02-11 DIAGNOSIS — C61 Malignant neoplasm of prostate: Secondary | ICD-10-CM

## 2023-02-12 ENCOUNTER — Telehealth: Payer: Self-pay | Admitting: Hematology

## 2023-02-12 ENCOUNTER — Other Ambulatory Visit: Payer: Self-pay | Admitting: Hematology

## 2023-02-12 NOTE — Telephone Encounter (Signed)
Contacted patient to scheduled appointments. Patient is aware of appointments that are scheduled.   

## 2023-02-13 ENCOUNTER — Other Ambulatory Visit: Payer: Self-pay

## 2023-02-14 ENCOUNTER — Other Ambulatory Visit: Payer: Self-pay

## 2023-02-14 DIAGNOSIS — C61 Malignant neoplasm of prostate: Secondary | ICD-10-CM

## 2023-02-14 MED ORDER — ENZALUTAMIDE 40 MG PO TABS: 160.0000 mg | ORAL_TABLET | Freq: Every day | ORAL | 0 refills | Status: DC

## 2023-03-04 ENCOUNTER — Other Ambulatory Visit: Payer: Self-pay | Admitting: Hematology

## 2023-03-04 ENCOUNTER — Other Ambulatory Visit: Payer: Self-pay

## 2023-03-04 DIAGNOSIS — C61 Malignant neoplasm of prostate: Secondary | ICD-10-CM

## 2023-03-06 ENCOUNTER — Inpatient Hospital Stay: Payer: Medicare Other | Attending: Nurse Practitioner

## 2023-03-06 ENCOUNTER — Other Ambulatory Visit: Payer: Self-pay

## 2023-03-06 ENCOUNTER — Encounter: Payer: Self-pay | Admitting: Hematology

## 2023-03-06 ENCOUNTER — Inpatient Hospital Stay (HOSPITAL_BASED_OUTPATIENT_CLINIC_OR_DEPARTMENT_OTHER): Payer: Medicare Other | Admitting: Hematology

## 2023-03-06 VITALS — BP 136/80 | HR 74 | Temp 97.9°F | Resp 17 | Ht 65.0 in | Wt 215.6 lb

## 2023-03-06 DIAGNOSIS — C7951 Secondary malignant neoplasm of bone: Secondary | ICD-10-CM | POA: Insufficient documentation

## 2023-03-06 DIAGNOSIS — C61 Malignant neoplasm of prostate: Secondary | ICD-10-CM | POA: Diagnosis present

## 2023-03-06 DIAGNOSIS — I10 Essential (primary) hypertension: Secondary | ICD-10-CM | POA: Insufficient documentation

## 2023-03-06 LAB — CBC WITH DIFFERENTIAL (CANCER CENTER ONLY)
Abs Immature Granulocytes: 0.01 10*3/uL (ref 0.00–0.07)
Basophils Absolute: 0 10*3/uL (ref 0.0–0.1)
Basophils Relative: 1 %
Eosinophils Absolute: 0.1 10*3/uL (ref 0.0–0.5)
Eosinophils Relative: 2 %
HCT: 39.8 % (ref 39.0–52.0)
Hemoglobin: 13.5 g/dL (ref 13.0–17.0)
Immature Granulocytes: 0 %
Lymphocytes Relative: 56 %
Lymphs Abs: 2.5 10*3/uL (ref 0.7–4.0)
MCH: 31.7 pg (ref 26.0–34.0)
MCHC: 33.9 g/dL (ref 30.0–36.0)
MCV: 93.4 fL (ref 80.0–100.0)
Monocytes Absolute: 0.4 10*3/uL (ref 0.1–1.0)
Monocytes Relative: 9 %
Neutro Abs: 1.4 10*3/uL — ABNORMAL LOW (ref 1.7–7.7)
Neutrophils Relative %: 32 %
Platelet Count: 195 10*3/uL (ref 150–400)
RBC: 4.26 MIL/uL (ref 4.22–5.81)
RDW: 13.1 % (ref 11.5–15.5)
WBC Count: 4.4 10*3/uL (ref 4.0–10.5)
nRBC: 0 % (ref 0.0–0.2)

## 2023-03-06 LAB — CMP (CANCER CENTER ONLY)
ALT: 9 U/L (ref 0–44)
AST: 14 U/L — ABNORMAL LOW (ref 15–41)
Albumin: 3.8 g/dL (ref 3.5–5.0)
Alkaline Phosphatase: 121 U/L (ref 38–126)
Anion gap: 7 (ref 5–15)
BUN: 17 mg/dL (ref 8–23)
CO2: 27 mmol/L (ref 22–32)
Calcium: 9.5 mg/dL (ref 8.9–10.3)
Chloride: 106 mmol/L (ref 98–111)
Creatinine: 1 mg/dL (ref 0.61–1.24)
GFR, Estimated: >60 mL/min (ref >60)
Glucose, Bld: 97 mg/dL (ref 70–99)
Potassium: 4.8 mmol/L (ref 3.5–5.1)
Sodium: 140 mmol/L (ref 135–145)
Total Bilirubin: 1 mg/dL (ref 0.3–1.2)
Total Protein: 7.3 g/dL (ref 6.5–8.1)

## 2023-03-06 NOTE — Progress Notes (Signed)
[place]   Telephone:([phone] Fax:(336) [phone]   Clinic Follow up Note   Patient Care Team: [name], MD as PCP - General (Family Medicine) Truitt Merle, MD as Attending Physician (Hematology and Oncology)  Date of Service:  03/06/2023  CHIEF COMPLAINT: f/u of Prostate Cancer  CURRENT THERAPY:  Eligard 30 mg every 4 months.  This will be repeated today and every 4 months.   Xtandi 160 mg daily started on October 11, 2017.   ASSESSMENT:  Benjamin Brown is a 77 y.o. 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  He presented with a PSA of 71.9, Gleason score of 8 in 2014.  -He is currently on Eligard 30 mg every 4 months, and Xtandi 160 milligram daily started on October 2018. -He has responded very well to treatment, his PSA has been undetectable -I have personally reviewed his previous lab and images in detail. -He is tolerating therapy well, no complains except mild fatigue      PLAN: -lab reviewed -PSA -pending -continue Eligard injection q 56month - refill for Xtandi -lab,f/u and injection in 2 months, we will see him back every 4 months after next appointment.    SUMMARY OF ONCOLOGIC HISTORY: Oncology History  [place]  [place] Initial Diagnosis   [place]      INTERVAL HISTORY:  Benjamin GRAVIERis here for a follow up of Prostate Cancer He was last seen by Dr.Shadad on 12/31/2022 He presents to the clinic alone. Pt state that has no change and since he has pain from when he was in [employer] Pt was went to the doctor from a physical and had his PAS test and that's how he was diagnose.Pt state he has hypertension.      All other systems were reviewed with the patient and are negative.  MEDICAL HISTORY:  Past Medical History:  Diagnosis Date   [place]    adenocarcinoma of the prostate gland   Pure hypercholesterolemia      SURGICAL HISTORY: Past Surgical History:  Procedure Laterality Date   APPENDECTOMY     prostate needle biopsy  07/23/2013    I have reviewed the social history and family history with the patient and they are unchanged from previous note.  ALLERGIES:  has No Known Allergies.  MEDICATIONS:  Current Outpatient Medications  Medication Sig Dispense Refill   XTANDI 40 MG tablet TAKE 4 TABLETS DAILY 120 tablet 0   No current facility-administered medications for this visit.    PHYSICAL EXAMINATION: ECOG PERFORMANCE STATUS: 1 - Symptomatic but completely ambulatory  Vitals:   03/06/23 1333  BP: 136/80  Pulse: 74  Resp: 17  Temp: 97.9 F (36.6 C)  SpO2: 98%   Wt Readings from Last 3 Encounters:  03/06/23 215 lb 9.6 oz (97.8 kg)  12/31/22 213 lb 11.2 oz (96.9 kg)  08/27/22 213 lb 9.6 oz (96.9 kg)      NECK:(-)  supple, thyroid normal size, non-tender, without nodularity LYMPH: (-)  no palpable lymphadenopathy in the cervical, axillary  LUNGS: (-) clear to auscultation and percussion with normal breathing effort HEART:(-)  regular rate & rhythm and no murmurs and (-) no lower extremity edema ABDOMEN:(-) abdomen soft, non-tender and normal bowel sounds   LABORATORY DATA:  I have reviewed the data as listed    Latest Ref Rng & Units 03/06/2023    1:11 PM 12/24/2022  1:21 PM 08/20/2022    9:46 AM  CBC  WBC 4.0 - 10.5 K/uL 4.4  4.6  3.6   Hemoglobin 13.0 - 17.0 g/dL 13.5  13.6  12.0   Hematocrit 39.0 - 52.0 % 39.8  40.7  35.6   Platelets 150 - 400 K/uL 195  202  190         Latest Ref Rng & Units 03/06/2023    1:11 PM 12/24/2022    1:21 PM 08/20/2022    9:46 AM  CMP  Glucose 70 - 99 mg/dL 97  85  76   BUN 8 - 23 mg/dL '17  16  13   '$ Creatinine 0.61 - 1.24 mg/dL 1.00  0.90  0.86   Sodium 135 - 145 mmol/L 140  141  139   Potassium 3.5 - 5.1 mmol/L 4.8  3.6  3.7   Chloride 98 - 111 mmol/L 106  106  107   CO2 22 - 32 mmol/L '27  29  27   '$ Calcium 8.9 - 10.3 mg/dL  9.5  9.3  9.2   Total Protein 6.5 - 8.1 g/dL 7.3  7.5  6.7   Total Bilirubin 0.3 - 1.2 mg/dL 1.0  0.7  1.2   Alkaline Phos 38 - 126 U/L 121  108  104   AST 15 - 41 U/L '14  14  17   '$ ALT 0 - 44 U/L '9  10  8       '$ RADIOGRAPHIC STUDIES: I have personally reviewed the radiological images as listed and agreed with the findings in the report. No results found.    No orders of the defined types were placed in this encounter.  All questions were answered. The patient knows to call the clinic with any problems, questions or concerns. No barriers to learning was detected. The total time spent in the appointment was 30 minutes.     Truitt Merle, MD 03/06/2023   Felicity Coyer, CMA, am acting as scribe for Truitt Merle, MD.   I have reviewed the above documentation for accuracy and completeness, and I agree with the above.

## 2023-03-06 NOTE — Assessment & Plan Note (Signed)
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  He presented with a PSA of 71.9, Gleason score of 8 in 2014.  -He is currently on Eligard 30 mg every 4 months, and Xtandi 160 milligram daily started on October 2018. -He has responded very well to treatment, his PSA has been undetectable -He is tolerating therapy well

## 2023-03-07 LAB — TESTOSTERONE: Testosterone: 13 ng/dL — ABNORMAL LOW (ref 264–916)

## 2023-03-07 LAB — PROSTATE-SPECIFIC AG, SERUM (LABCORP): Prostate Specific Ag, Serum: <0.1 ng/mL (ref 0.0–4.0)

## 2023-03-28 ENCOUNTER — Other Ambulatory Visit: Payer: Self-pay

## 2023-03-28 DIAGNOSIS — C61 Malignant neoplasm of prostate: Secondary | ICD-10-CM

## 2023-03-28 MED ORDER — ENZALUTAMIDE 40 MG PO TABS: 160.0000 mg | ORAL_TABLET | Freq: Every day | ORAL | 0 refills | Status: DC

## 2023-04-14 ENCOUNTER — Other Ambulatory Visit: Payer: Self-pay | Admitting: Hematology

## 2023-04-14 DIAGNOSIS — C61 Malignant neoplasm of prostate: Secondary | ICD-10-CM

## 2023-04-15 ENCOUNTER — Other Ambulatory Visit: Payer: Self-pay

## 2023-04-24 ENCOUNTER — Other Ambulatory Visit: Payer: Self-pay

## 2023-04-24 ENCOUNTER — Inpatient Hospital Stay: Payer: Medicare Other | Attending: Nurse Practitioner

## 2023-04-24 DIAGNOSIS — Z5111 Encounter for antineoplastic chemotherapy: Secondary | ICD-10-CM | POA: Diagnosis present

## 2023-04-24 DIAGNOSIS — C7951 Secondary malignant neoplasm of bone: Secondary | ICD-10-CM | POA: Diagnosis present

## 2023-04-24 DIAGNOSIS — C61 Malignant neoplasm of prostate: Secondary | ICD-10-CM | POA: Diagnosis present

## 2023-04-24 LAB — CBC WITH DIFFERENTIAL (CANCER CENTER ONLY)
Abs Immature Granulocytes: 0.01 10*3/uL (ref 0.00–0.07)
Basophils Absolute: 0 10*3/uL (ref 0.0–0.1)
Basophils Relative: 1 %
Eosinophils Absolute: 0.1 10*3/uL (ref 0.0–0.5)
Eosinophils Relative: 1 %
HCT: 40.1 % (ref 39.0–52.0)
Hemoglobin: 13.3 g/dL (ref 13.0–17.0)
Immature Granulocytes: 0 %
Lymphocytes Relative: 53 %
Lymphs Abs: 2.2 10*3/uL (ref 0.7–4.0)
MCH: 31.1 pg (ref 26.0–34.0)
MCHC: 33.2 g/dL (ref 30.0–36.0)
MCV: 93.9 fL (ref 80.0–100.0)
Monocytes Absolute: 0.4 10*3/uL (ref 0.1–1.0)
Monocytes Relative: 9 %
Neutro Abs: 1.5 10*3/uL — ABNORMAL LOW (ref 1.7–7.7)
Neutrophils Relative %: 36 %
Platelet Count: 201 10*3/uL (ref 150–400)
RBC: 4.27 MIL/uL (ref 4.22–5.81)
RDW: 12.9 % (ref 11.5–15.5)
WBC Count: 4.1 10*3/uL (ref 4.0–10.5)
nRBC: 0 % (ref 0.0–0.2)

## 2023-04-24 LAB — CMP (CANCER CENTER ONLY)
ALT: 12 U/L (ref 0–44)
AST: 16 U/L (ref 15–41)
Albumin: 3.9 g/dL (ref 3.5–5.0)
Alkaline Phosphatase: 127 U/L — ABNORMAL HIGH (ref 38–126)
Anion gap: 6 (ref 5–15)
BUN: 20 mg/dL (ref 8–23)
CO2: 28 mmol/L (ref 22–32)
Calcium: 9.4 mg/dL (ref 8.9–10.3)
Chloride: 106 mmol/L (ref 98–111)
Creatinine: 1.02 mg/dL (ref 0.61–1.24)
GFR, Estimated: >60 mL/min (ref >60)
Glucose, Bld: 99 mg/dL (ref 70–99)
Potassium: 4.7 mmol/L (ref 3.5–5.1)
Sodium: 140 mmol/L (ref 135–145)
Total Bilirubin: 1 mg/dL (ref 0.3–1.2)
Total Protein: 7.5 g/dL (ref 6.5–8.1)

## 2023-04-26 LAB — TESTOSTERONE: Testosterone: 14 ng/dL — ABNORMAL LOW (ref 264–916)

## 2023-04-26 LAB — PROSTATE-SPECIFIC AG, SERUM (LABCORP): Prostate Specific Ag, Serum: <0.1 ng/mL (ref 0.0–4.0)

## 2023-04-30 NOTE — Progress Notes (Unsigned)
[facility]   [phone]) [phone] [phone]) [phone]   Clinic Follow up Note   Patient Care Team: Mirna Mires, MD as PCP - General (Family Medicine) Malachy Mood, MD as Attending Physician (Hematology and Oncology)  Date of Service:  05/01/2023  CHIEF COMPLAINT: f/u of Prostate Cancer   CURRENT THERAPY:   Eligard 30 mg every 4 months.  This will be repeated today and every 4 months.   Xtandi 160 mg daily started on October 11, 2017.  ASSESSMENT:  Benjamin Brown is a 77 y.o. 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  He presented with a PSA of 71.9, Gleason score of 8 in 2014.  -He is currently on Eligard 30 mg every 4 months, and Xtandi 160 milligram daily started on October 2018. -He has responded very well to treatment, his PSA has been undetectable -I have personally reviewed his previous lab and images in detail. -He is tolerating therapy well, no complains except mild fatigue, we discussed high-protein diet, and exercise, to improve his energy. -Lab reviewed with patient today.   PLAN: -lab reviewed -I refill Xtandi -CMP reviewed -PSA -undetectable -proceed with the Eligard injection today -lab and f/u in 4 months   SUMMARY OF ONCOLOGIC HISTORY: Oncology History  Prostate cancer (HCC)  09/02/2013 Initial Diagnosis   Prostate cancer (HCC)      INTERVAL HISTORY:  Benjamin Brown is here for a follow up of Prostate Cancer. He was last seen by me on 3/142024. He presents to the clinic alone. Pt state that he is doing well, but still have some mild fatigue. Pt does walking for exercise but that's about it. H e has some joint pain. Pt state he's taking a multivitamin and it seems to help with some energy.      All other systems were reviewed with the patient and are negative.  MEDICAL HISTORY:  Past Medical History:  Diagnosis Date   Prostate cancer (HCC)     adenocarcinoma of the prostate gland   Pure hypercholesterolemia     SURGICAL HISTORY: Past Surgical History:  Procedure Laterality Date   APPENDECTOMY     prostate needle biopsy  07/23/2013    I have reviewed the social history and family history with the patient and they are unchanged from previous note.  ALLERGIES:  has No Known Allergies.  MEDICATIONS:  Current Outpatient Medications  Medication Sig Dispense Refill   enzalutamide (XTANDI) 40 MG tablet Take 4 tablets (160 mg total) by mouth daily. 120 tablet 4   No current facility-administered medications for this visit.    PHYSICAL EXAMINATION: ECOG PERFORMANCE STATUS: 1 - Symptomatic but completely ambulatory  Vitals:   05/01/23 1117  BP: 133/88  Pulse: 63  Resp: 18  Temp: 98.6 F (37 C)  SpO2: 98%   Wt Readings from Last 3 Encounters:  05/01/23 219 lb 1.6 oz (99.4 kg)  03/06/23 215 lb 9.6 oz (97.8 kg)  12/31/22 213 lb 11.2 oz (96.9 kg)     GENERAL:alert, no distress and comfortable SKIN: skin color normal, no rashes or significant lesions EYES: normal, Conjunctiva are pink and non-injected, sclera clear  NEURO: alert & oriented x 3 with fluent speech  LABORATORY DATA:  I have reviewed the data as listed    Latest Ref Rng & Units 04/24/2023   10:33 AM 03/06/2023    1:11 PM 12/24/2022    1:21 PM  CBC  WBC 4.0 -  10.5 K/uL 4.1  4.4  4.6   Hemoglobin 13.0 - 17.0 g/dL 16.1  09.6  04.5   Hematocrit 39.0 - 52.0 % 40.1  39.8  40.7   Platelets 150 - 400 K/uL 201  195  202         Latest Ref Rng & Units 04/24/2023   10:33 AM 03/06/2023    1:11 PM 12/24/2022    1:21 PM  CMP  Glucose 70 - 99 mg/dL 99  97  85   BUN 8 - 23 mg/dL 20  17  16    Creatinine 0.61 - 1.24 mg/dL 4.09  8.11  9.14   Sodium 135 - 145 mmol/L 140  140  141   Potassium 3.5 - 5.1 mmol/L 4.7  4.8  3.6   Chloride 98 - 111 mmol/L 106  106  106   CO2 22 - 32 mmol/L 28  27  29    Calcium 8.9 - 10.3 mg/dL 9.4  9.5  9.3   Total Protein 6.5 - 8.1 g/dL  7.5  7.3  7.5   Total Bilirubin 0.3 - 1.2 mg/dL 1.0  1.0  0.7   Alkaline Phos 38 - 126 U/L 127  121  108   AST 15 - 41 U/L 16  14  14    ALT 0 - 44 U/L 12  9  10        RADIOGRAPHIC STUDIES: I have personally reviewed the radiological images as listed and agreed with the findings in the report. No results found.    No orders of the defined types were placed in this encounter.  All questions were answered. The patient knows to call the clinic with any problems, questions or concerns. No barriers to learning was detected. The total time spent in the appointment was 20 minutes.     Malachy Mood, MD 05/01/2023   Carolin Coy, CMA, am acting as scribe for Malachy Mood, MD.   I have reviewed the above documentation for accuracy and completeness, and I agree with the above.

## 2023-05-01 ENCOUNTER — Inpatient Hospital Stay: Payer: Medicare Other

## 2023-05-01 ENCOUNTER — Inpatient Hospital Stay (HOSPITAL_BASED_OUTPATIENT_CLINIC_OR_DEPARTMENT_OTHER): Payer: Medicare Other | Admitting: Hematology

## 2023-05-01 ENCOUNTER — Other Ambulatory Visit: Payer: Self-pay

## 2023-05-01 ENCOUNTER — Encounter: Payer: Self-pay | Admitting: Hematology

## 2023-05-01 VITALS — BP 133/88 | HR 63 | Temp 98.6°F | Resp 18 | Ht 65.0 in | Wt 219.1 lb

## 2023-05-01 DIAGNOSIS — C61 Malignant neoplasm of prostate: Secondary | ICD-10-CM | POA: Diagnosis not present

## 2023-05-01 DIAGNOSIS — Z5111 Encounter for antineoplastic chemotherapy: Secondary | ICD-10-CM | POA: Diagnosis not present

## 2023-05-01 MED ORDER — LEUPROLIDE ACETATE (4 MONTH) 30 MG ~~LOC~~ KIT
30.0000 mg | PACK | Freq: Once | SUBCUTANEOUS | Status: AC
  Administered 2023-05-01: 30 mg via SUBCUTANEOUS
  Filled 2023-05-01: qty 30

## 2023-05-01 MED ORDER — ENZALUTAMIDE 40 MG PO TABS: 160.0000 mg | ORAL_TABLET | Freq: Every day | ORAL | 4 refills | Status: DC

## 2023-05-01 NOTE — Assessment & Plan Note (Signed)
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  He presented with a PSA of 71.9, Gleason score of 8 in 2014.  -He is currently on Eligard 30 mg every 4 months, and Xtandi 160 milligram daily started on October 2018. -He has responded very well to treatment, his PSA has been undetectable -I have personally reviewed his previous lab and images in detail. -He is tolerating therapy well, no complains except mild fatigue

## 2023-08-29 NOTE — Progress Notes (Unsigned)
[facility]   [phone]) [phone] [phone]) [phone]   Clinic Follow up Note   Patient Care Team: Mirna Mires, MD as PCP - General (Family Medicine) Malachy Mood, MD as Attending Physician (Hematology and Oncology)  Date of Service:  09/01/2023  CHIEF COMPLAINT: f/u of Prostate Cancer   CURRENT THERAPY:  Eligard 30 mg every 4 months.  This will be repeated today and every 4 months.   Xtandi 160 mg daily started on October 11, 2017.  ASSESSMENT:  Benjamin Brown is a 77 y.o. 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  He presented with a PSA of 71.9, Gleason score of 8 in 2014.  -He is currently on Eligard 30 mg every 4 months, and Xtandi 160 milligram daily started on October 2018. -He has responded very well to treatment, his PSA has been undetectable -I have personally reviewed his previous lab and images in detail. -He is tolerating therapy well, no complains except mild fatigue   -due to insurance coverage issue, he was out of Xtandi for two months but restarted in 07/2023  -lab reviewed, PSA still pending  -continue current therapy    PLAN: -lab reviewed -CMP and PSA-pending - Continue Xtandi   -proceed with the Eligard injection today -lab a few day before f/u in 4 months  SUMMARY OF ONCOLOGIC HISTORY: Oncology History  Prostate cancer (HCC)  09/02/2013 Initial Diagnosis   Prostate cancer (HCC)      INTERVAL HISTORY:  Benjamin Brown is here for a follow up of Prostate Cancer. He was last seen by me on 05/01/2023. He presents to the clinic alone. Pt state that he had an issue with his health insurance. So the pt state that he was with out the [facility] for two months. Pt state that he felt better since he has been off for the two months., but is currently back on now. Pt state that he is no longer taking calcium and Vitamin D.   All other systems were reviewed with  the patient and are negative.  MEDICAL HISTORY:  Past Medical History:  Diagnosis Date   Prostate cancer (HCC)    adenocarcinoma of the prostate gland   Pure hypercholesterolemia     SURGICAL HISTORY: Past Surgical History:  Procedure Laterality Date   APPENDECTOMY     prostate needle biopsy  07/23/2013    I have reviewed the social history and family history with the patient and they are unchanged from previous note.  ALLERGIES:  has No Known Allergies.  MEDICATIONS:  Current Outpatient Medications  Medication Sig Dispense Refill   enzalutamide (XTANDI) 40 MG tablet Take 4 tablets (160 mg total) by mouth daily. 120 tablet 4   No current facility-administered medications for this visit.    PHYSICAL EXAMINATION: ECOG PERFORMANCE STATUS: 0 - Asymptomatic  Vitals:   09/01/23 1123  BP: 132/76  Pulse: 60  Resp: 17  Temp: 98.2 F (36.8 C)  SpO2: 98%   Wt Readings from Last 3 Encounters:  09/01/23 227 lb 11.2 oz (103.3 kg)  05/01/23 219 lb 1.6 oz (99.4 kg)  03/06/23 215 lb 9.6 oz (97.8 kg)     GENERAL:alert, no distress and comfortable SKIN: skin color normal, no rashes or significant lesions EYES: normal, Conjunctiva are pink and non-injected, sclera clear  NEURO: alert & oriented x 3 with fluent speech LABORATORY DATA:  I have reviewed the data as listed  Latest Ref Rng & Units 09/01/2023   10:44 AM 04/24/2023   10:33 AM 03/06/2023    1:11 PM  CBC  WBC 4.0 - 10.5 K/uL 4.3  4.1  4.4   Hemoglobin 13.0 - 17.0 g/dL 29.5  28.4  13.2   Hematocrit 39.0 - 52.0 % 38.9  40.1  39.8   Platelets 150 - 400 K/uL 200  201  195         Latest Ref Rng & Units 09/01/2023   10:44 AM 04/24/2023   10:33 AM 03/06/2023    1:11 PM  CMP  Glucose 70 - 99 mg/dL 97  99  97   BUN 8 - 23 mg/dL 17  20  17    Creatinine 0.61 - 1.24 mg/dL 4.40  1.02  7.25   Sodium 135 - 145 mmol/L 138  140  140   Potassium 3.5 - 5.1 mmol/L 4.4  4.7  4.8   Chloride 98 - 111 mmol/L 106  106  106   CO2 22  - 32 mmol/L 27  28  27    Calcium 8.9 - 10.3 mg/dL 9.0  9.4  9.5   Total Protein 6.5 - 8.1 g/dL 7.5  7.5  7.3   Total Bilirubin 0.3 - 1.2 mg/dL 0.9  1.0  1.0   Alkaline Phos 38 - 126 U/L 129  127  121   AST 15 - 41 U/L 15  16  14    ALT 0 - 44 U/L 15  12  9        RADIOGRAPHIC STUDIES: I have personally reviewed the radiological images as listed and agreed with the findings in the report. No results found.    No orders of the defined types were placed in this encounter.  All questions were answered. The patient knows to call the clinic with any problems, questions or concerns. No barriers to learning was detected. The total time spent in the appointment was 25 minutes.     Malachy Mood, MD 09/01/2023   Carolin Coy, CMA, am acting as scribe for Malachy Mood, MD.   I have reviewed the above documentation for accuracy and completeness, and I agree with the above.

## 2023-08-31 NOTE — Assessment & Plan Note (Signed)
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  He presented with a PSA of 71.9, Gleason score of 8 in 2014.  -He is currently on Eligard 30 mg every 4 months, and Xtandi 160 milligram daily started on October 2018. -He has responded very well to treatment, his PSA has been undetectable -I have personally reviewed his previous lab and images in detail. -He is tolerating therapy well, no complains except mild fatigue

## 2023-09-01 ENCOUNTER — Inpatient Hospital Stay: Payer: Medicare Other | Attending: Hematology

## 2023-09-01 ENCOUNTER — Encounter: Payer: Self-pay | Admitting: Hematology

## 2023-09-01 ENCOUNTER — Inpatient Hospital Stay (HOSPITAL_BASED_OUTPATIENT_CLINIC_OR_DEPARTMENT_OTHER): Payer: Medicare Other | Admitting: Hematology

## 2023-09-01 ENCOUNTER — Inpatient Hospital Stay: Payer: Medicare Other

## 2023-09-01 VITALS — BP 132/76 | HR 60 | Temp 98.2°F | Resp 17 | Ht 65.0 in | Wt 227.7 lb

## 2023-09-01 DIAGNOSIS — C61 Malignant neoplasm of prostate: Secondary | ICD-10-CM | POA: Insufficient documentation

## 2023-09-01 DIAGNOSIS — C7951 Secondary malignant neoplasm of bone: Secondary | ICD-10-CM | POA: Diagnosis present

## 2023-09-01 DIAGNOSIS — Z5111 Encounter for antineoplastic chemotherapy: Secondary | ICD-10-CM | POA: Insufficient documentation

## 2023-09-01 LAB — CBC WITH DIFFERENTIAL (CANCER CENTER ONLY)
Abs Immature Granulocytes: 0 10*3/uL (ref 0.00–0.07)
Basophils Absolute: 0 10*3/uL (ref 0.0–0.1)
Basophils Relative: 0 %
Eosinophils Absolute: 0.1 10*3/uL (ref 0.0–0.5)
Eosinophils Relative: 2 %
HCT: 38.9 % — ABNORMAL LOW (ref 39.0–52.0)
Hemoglobin: 12.7 g/dL — ABNORMAL LOW (ref 13.0–17.0)
Immature Granulocytes: 0 %
Lymphocytes Relative: 51 %
Lymphs Abs: 2.2 10*3/uL (ref 0.7–4.0)
MCH: 30.5 pg (ref 26.0–34.0)
MCHC: 32.6 g/dL (ref 30.0–36.0)
MCV: 93.5 fL (ref 80.0–100.0)
Monocytes Absolute: 0.3 10*3/uL (ref 0.1–1.0)
Monocytes Relative: 7 %
Neutro Abs: 1.7 10*3/uL (ref 1.7–7.7)
Neutrophils Relative %: 40 %
Platelet Count: 200 10*3/uL (ref 150–400)
RBC: 4.16 MIL/uL — ABNORMAL LOW (ref 4.22–5.81)
RDW: 13.3 % (ref 11.5–15.5)
WBC Count: 4.3 10*3/uL (ref 4.0–10.5)
nRBC: 0 % (ref 0.0–0.2)

## 2023-09-01 LAB — CMP (CANCER CENTER ONLY)
ALT: 15 U/L (ref 0–44)
AST: 15 U/L (ref 15–41)
Albumin: 3.9 g/dL (ref 3.5–5.0)
Alkaline Phosphatase: 129 U/L — ABNORMAL HIGH (ref 38–126)
Anion gap: 5 (ref 5–15)
BUN: 17 mg/dL (ref 8–23)
CO2: 27 mmol/L (ref 22–32)
Calcium: 9 mg/dL (ref 8.9–10.3)
Chloride: 106 mmol/L (ref 98–111)
Creatinine: 0.94 mg/dL (ref 0.61–1.24)
GFR, Estimated: >60 mL/min (ref >60)
Glucose, Bld: 97 mg/dL (ref 70–99)
Potassium: 4.4 mmol/L (ref 3.5–5.1)
Sodium: 138 mmol/L (ref 135–145)
Total Bilirubin: 0.9 mg/dL (ref 0.3–1.2)
Total Protein: 7.5 g/dL (ref 6.5–8.1)

## 2023-09-01 MED ORDER — LEUPROLIDE ACETATE (4 MONTH) 30 MG ~~LOC~~ KIT
30.0000 mg | PACK | Freq: Once | SUBCUTANEOUS | Status: AC
  Administered 2023-09-01: 30 mg via SUBCUTANEOUS
  Filled 2023-09-01: qty 30

## 2023-09-01 NOTE — Patient Instructions (Signed)

## 2023-09-02 LAB — PROSTATE-SPECIFIC AG, SERUM (LABCORP): Prostate Specific Ag, Serum: 0.2 ng/mL (ref 0.0–4.0)

## 2023-09-02 LAB — TESTOSTERONE: Testosterone: 6 ng/dL — ABNORMAL LOW (ref 264–916)

## 2023-09-15 DIAGNOSIS — I10 Essential (primary) hypertension: Secondary | ICD-10-CM | POA: Diagnosis not present

## 2023-09-15 DIAGNOSIS — C61 Malignant neoplasm of prostate: Secondary | ICD-10-CM | POA: Diagnosis not present

## 2023-09-15 DIAGNOSIS — E7800 Pure hypercholesterolemia, unspecified: Secondary | ICD-10-CM | POA: Diagnosis not present

## 2023-09-15 DIAGNOSIS — E663 Overweight: Secondary | ICD-10-CM | POA: Diagnosis not present

## 2023-10-02 ENCOUNTER — Other Ambulatory Visit: Payer: Self-pay

## 2023-12-29 ENCOUNTER — Encounter: Payer: Self-pay | Admitting: Hematology

## 2023-12-29 ENCOUNTER — Inpatient Hospital Stay: Payer: Medicare Other | Attending: Hematology

## 2023-12-29 DIAGNOSIS — C61 Malignant neoplasm of prostate: Secondary | ICD-10-CM | POA: Diagnosis not present

## 2023-12-29 DIAGNOSIS — Z5111 Encounter for antineoplastic chemotherapy: Secondary | ICD-10-CM | POA: Insufficient documentation

## 2023-12-29 DIAGNOSIS — C7951 Secondary malignant neoplasm of bone: Secondary | ICD-10-CM | POA: Insufficient documentation

## 2023-12-29 LAB — CBC WITH DIFFERENTIAL (CANCER CENTER ONLY)
Abs Immature Granulocytes: 0.01 10*3/uL (ref 0.00–0.07)
Basophils Absolute: 0 10*3/uL (ref 0.0–0.1)
Basophils Relative: 1 %
Eosinophils Absolute: 0.1 10*3/uL (ref 0.0–0.5)
Eosinophils Relative: 1 %
HCT: 39.9 % (ref 39.0–52.0)
Hemoglobin: 13 g/dL (ref 13.0–17.0)
Immature Granulocytes: 0 %
Lymphocytes Relative: 47 %
Lymphs Abs: 2.2 10*3/uL (ref 0.7–4.0)
MCH: 30.2 pg (ref 26.0–34.0)
MCHC: 32.6 g/dL (ref 30.0–36.0)
MCV: 92.6 fL (ref 80.0–100.0)
Monocytes Absolute: 0.3 10*3/uL (ref 0.1–1.0)
Monocytes Relative: 6 %
Neutro Abs: 2.1 10*3/uL (ref 1.7–7.7)
Neutrophils Relative %: 45 %
Platelet Count: 211 10*3/uL (ref 150–400)
RBC: 4.31 MIL/uL (ref 4.22–5.81)
RDW: 13.2 % (ref 11.5–15.5)
WBC Count: 4.6 10*3/uL (ref 4.0–10.5)
nRBC: 0 % (ref 0.0–0.2)

## 2023-12-29 LAB — CMP (CANCER CENTER ONLY)
ALT: 31 U/L (ref 0–44)
AST: 25 U/L (ref 15–41)
Albumin: 4 g/dL (ref 3.5–5.0)
Alkaline Phosphatase: 131 U/L — ABNORMAL HIGH (ref 38–126)
Anion gap: 6 (ref 5–15)
BUN: 18 mg/dL (ref 8–23)
CO2: 28 mmol/L (ref 22–32)
Calcium: 9.3 mg/dL (ref 8.9–10.3)
Chloride: 106 mmol/L (ref 98–111)
Creatinine: 0.95 mg/dL (ref 0.61–1.24)
GFR, Estimated: >60 mL/min (ref >60)
Glucose, Bld: 101 mg/dL — ABNORMAL HIGH (ref 70–99)
Potassium: 4.5 mmol/L (ref 3.5–5.1)
Sodium: 140 mmol/L (ref 135–145)
Total Bilirubin: 0.6 mg/dL (ref 0.0–1.2)
Total Protein: 7.8 g/dL (ref 6.5–8.1)

## 2023-12-30 LAB — TESTOSTERONE: Testosterone: 7 ng/dL — ABNORMAL LOW (ref 264–916)

## 2023-12-30 LAB — PROSTATE-SPECIFIC AG, SERUM (LABCORP): Prostate Specific Ag, Serum: 0.7 ng/mL (ref 0.0–4.0)

## 2024-01-04 NOTE — Assessment & Plan Note (Signed)
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  He presented with a PSA of 71.9, Gleason score of 8 in 2014.  -He is currently on Eligard 30 mg every 4 months, and Xtandi 160 milligram daily started on October 2018. -He has responded very well to treatment, his PSA has been undetectable -I have personally reviewed his previous lab and images in detail. -He is tolerating therapy well, no complains except mild fatigue

## 2024-01-05 ENCOUNTER — Other Ambulatory Visit: Payer: Self-pay

## 2024-01-05 ENCOUNTER — Other Ambulatory Visit (HOSPITAL_COMMUNITY): Payer: Self-pay

## 2024-01-05 ENCOUNTER — Encounter: Payer: Self-pay | Admitting: Hematology

## 2024-01-05 ENCOUNTER — Inpatient Hospital Stay (HOSPITAL_BASED_OUTPATIENT_CLINIC_OR_DEPARTMENT_OTHER): Payer: Medicare Other | Admitting: Hematology

## 2024-01-05 ENCOUNTER — Inpatient Hospital Stay: Payer: Medicare Other

## 2024-01-05 ENCOUNTER — Other Ambulatory Visit: Payer: Self-pay | Admitting: Pharmacy Technician

## 2024-01-05 VITALS — BP 152/72 | HR 77 | Temp 97.4°F | Resp 16 | Wt 228.7 lb

## 2024-01-05 VITALS — BP 144/68

## 2024-01-05 DIAGNOSIS — Z5111 Encounter for antineoplastic chemotherapy: Secondary | ICD-10-CM | POA: Diagnosis not present

## 2024-01-05 DIAGNOSIS — C7951 Secondary malignant neoplasm of bone: Secondary | ICD-10-CM | POA: Diagnosis not present

## 2024-01-05 DIAGNOSIS — C61 Malignant neoplasm of prostate: Secondary | ICD-10-CM

## 2024-01-05 MED ORDER — LEUPROLIDE ACETATE (4 MONTH) 30 MG ~~LOC~~ KIT
30.0000 mg | PACK | Freq: Once | SUBCUTANEOUS | Status: AC
  Administered 2024-01-05: 30 mg via SUBCUTANEOUS
  Filled 2024-01-05: qty 30

## 2024-01-05 MED ORDER — ENZALUTAMIDE 80 MG PO TABS
80.0000 mg | ORAL_TABLET | Freq: Every day | ORAL | 3 refills | Status: DC
  Filled 2024-01-05: qty 30, 30d supply, fill #0
  Filled 2024-01-27: qty 30, 30d supply, fill #1
  Filled 2024-02-24: qty 30, 30d supply, fill #2
  Filled 2024-03-23: qty 30, 30d supply, fill #3

## 2024-01-05 NOTE — Progress Notes (Signed)
 Specialty Pharmacy Initial Fill Coordination Note  Benjamin Brown is a 78 y.o. male contacted today regarding refills of specialty medication(s) Enzalutamide  (XTANDI ) .  Patient requested Delivery  on 01/07/24  to verified address [address]  [place] [address]   Medication will be filled on 01/06/24.   Patient is aware of $16 copayment.

## 2024-01-05 NOTE — Progress Notes (Signed)
 [facility]   Telephone:(336) [phone] Fax:([phone]) [phone]   Clinic Follow up Note   Patient Care Team: Leigh Lung, MD as PCP - General (Family Medicine) Lanny Callander, MD as Attending Physician (Hematology and Oncology)  Date of Service:  01/05/2024  CHIEF COMPLAINT: f/u of prostate cancer  CURRENT THERAPY:  Eligard  30 mg every 4 months.    Xtandi  160 mg daily started on October 11, 2017. 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  He presented with a PSA of 71.9, Gleason score of 8 in 2014.  -He is currently on Eligard  30 mg every 4 months, and Xtandi  160 milligram daily started on October 2018. -He has responded very well to treatment, his PSA has been undetectable -I have personally reviewed his previous lab and images in detail. -He is tolerating therapy well, no complains except mild fatigue       Assessment and Plan    Metastatic Prostate Cancer 78 year old with metastatic prostate cancer, not taking Xtandi  due to insurance issues. Reports reduced fatigue and resolution of trismus off medication. PSA increased from 0.1 to 0.7. Hemoglobin, kidney, and liver function normal. Testosterone  suppressed at 7, indicating effective injection therapy. Discussed financial assistance for Xtandi  or switching to 80 mg dose. Alternative medications like Zytiga discussed. Addressed concerns about new radioactive treatment, explaining safety and side effects, including reduced immunity and anemia. [facility] if other [facility] options fail. - Contact oral pharmacist [place] for financial assistance for Xtandi  or alternatives - Continue current injection therapy -Plan to transfer his care to my partner Dr. Tina, schedule follow-up with Dr. Tina in four months -  Provide lab results  Hypertension Elevated blood pressure attributed to missed medication dose. Advised rechecking blood pressure for accuracy. - Recheck blood pressure before leaving clinic  General Health Maintenance Discussed potential benefits of using [facility] if other insurance options fail. - Consider [facility] if other insurance options fail  Plan -checked with our oral pharmacy, and called in Xtandi  80-minute once daily to [facility] pharmacy, he has $16 co-pay. -He will proceed with Eligard  injection today and continue every 4 months - Schedule follow-up with Dr. Tina in four months.         SUMMARY OF ONCOLOGIC HISTORY: Oncology History  Prostate cancer (HCC)  09/02/2013 Initial Diagnosis   Prostate cancer [facility])      Discussed the use of AI scribe software for clinical note transcription with the patient, who gave verbal consent to proceed.  History of Present Illness   The patient, a 78 year old gentleman with metastatic prostate cancer, presents for follow-up. He reports feeling better since discontinuing Xtandi  due to insurance issues. While on Xtandi , he experienced fatigue and developed trismus, which he believes was related to the medication. Since discontinuing Xtandi , the trismus has resolved. He has not been able to resume Xtandi  due to insurance not covering the cost. He is considering switching to a lower dose if it becomes available. He also mentions a potential interest in a new medication he saw advertised on television. He has not reported any new pain or discomfort.         All other systems were reviewed with the patient and are negative.  MEDICAL HISTORY:  Past Medical History:  Diagnosis Date   Prostate cancer (HCC)  adenocarcinoma of the prostate gland   Pure hypercholesterolemia     SURGICAL HISTORY: Past Surgical History:  Procedure Laterality Date   APPENDECTOMY     prostate needle biopsy  07/23/2013    I have reviewed the social  history and family history with the patient and they are unchanged from previous note.  ALLERGIES:  has no known allergies.  MEDICATIONS:  Current Outpatient Medications  Medication Sig Dispense Refill   enzalutamide  (XTANDI ) 80 MG tablet Take 1 tablet (80 mg total) by mouth daily. 30 tablet 3   No current facility-administered medications for this visit.    PHYSICAL EXAMINATION: ECOG PERFORMANCE STATUS: 0 - Asymptomatic  Vitals:   01/05/24 1052  BP: (!) 152/72  Pulse: 77  Resp: 16  Temp: (!) 97.4 F (36.3 C)  SpO2: 100%   Wt Readings from Last 3 Encounters:  01/05/24 228 lb 11.2 oz (103.7 kg)  09/01/23 227 lb 11.2 oz (103.3 kg)  05/01/23 219 lb 1.6 oz (99.4 kg)     GENERAL:alert, no distress and comfortable SKIN: skin color, texture, turgor are normal, no rashes or significant lesions EYES: normal, Conjunctiva are pink and non-injected, sclera clear NECK: supple, thyroid normal size, non-tender, without nodularity LYMPH:  no palpable lymphadenopathy in the cervical, axillary  LUNGS: clear to auscultation and percussion with normal breathing effort HEART: regular rate & rhythm and no murmurs and no lower extremity edema ABDOMEN:abdomen soft, non-tender and normal bowel sounds Musculoskeletal:no cyanosis of digits and no clubbing  NEURO: alert & oriented x 3 with fluent speech, no focal motor/sensory deficits   LABORATORY DATA:  I have reviewed the data as listed    Latest Ref Rng & Units 12/29/2023   11:00 AM 09/01/2023   10:44 AM 04/24/2023   10:33 AM  CBC  WBC 4.0 - 10.5 K/uL 4.6  4.3  4.1   Hemoglobin 13.0 - 17.0 g/dL 86.9  87.2  86.6   Hematocrit 39.0 - 52.0 % 39.9  38.9  40.1   Platelets 150 - 400 K/uL 211  200  201         Latest Ref Rng & Units 12/29/2023   11:00 AM 09/01/2023   10:44 AM 04/24/2023   10:33 AM  CMP  Glucose 70 - 99 mg/dL 898  97  99   BUN 8 - 23 mg/dL 18  17  20    Creatinine 0.61 - 1.24 mg/dL 9.04  9.05  8.97   Sodium 135 - 145 mmol/L 140   138  140   Potassium 3.5 - 5.1 mmol/L 4.5  4.4  4.7   Chloride 98 - 111 mmol/L 106  106  106   CO2 22 - 32 mmol/L 28  27  28    Calcium 8.9 - 10.3 mg/dL 9.3  9.0  9.4   Total Protein 6.5 - 8.1 g/dL 7.8  7.5  7.5   Total Bilirubin 0.0 - 1.2 mg/dL 0.6  0.9  1.0   Alkaline Phos 38 - 126 U/L 131  129  127   AST 15 - 41 U/L 25  15  16    ALT 0 - 44 U/L 31  15  12        RADIOGRAPHIC STUDIES: I have personally reviewed the radiological images as listed and agreed with the findings in the report. No results found.    No orders of the defined types were placed in this encounter.  All questions were answered. The patient knows to call the clinic with any  problems, questions or concerns. No barriers to learning was detected. The total time spent in the appointment was 30 minutes.     Onita Mattock, MD 01/05/2024

## 2024-01-05 NOTE — Progress Notes (Signed)
 Oral Chemotherapy Pharmacist Encounter  Patient was counseled under telephone encounter from 09/26/2017.  Asberry Macintosh, PharmD, BCPS, BCOP Hematology/Oncology Clinical Pharmacist [facility] Oral Chemotherapy Navigation Clinics [phone] 01/05/2024 2:15 PM

## 2024-01-06 ENCOUNTER — Other Ambulatory Visit: Payer: Self-pay

## 2024-01-13 DIAGNOSIS — E7800 Pure hypercholesterolemia, unspecified: Secondary | ICD-10-CM | POA: Diagnosis not present

## 2024-01-13 DIAGNOSIS — I10 Essential (primary) hypertension: Secondary | ICD-10-CM | POA: Diagnosis not present

## 2024-01-13 DIAGNOSIS — C61 Malignant neoplasm of prostate: Secondary | ICD-10-CM | POA: Diagnosis not present

## 2024-01-13 DIAGNOSIS — E663 Overweight: Secondary | ICD-10-CM | POA: Diagnosis not present

## 2024-01-23 ENCOUNTER — Other Ambulatory Visit: Payer: Self-pay

## 2024-01-27 ENCOUNTER — Other Ambulatory Visit (HOSPITAL_COMMUNITY): Payer: Self-pay

## 2024-01-27 ENCOUNTER — Other Ambulatory Visit (HOSPITAL_COMMUNITY): Payer: Self-pay | Admitting: Pharmacy Technician

## 2024-01-27 NOTE — Progress Notes (Signed)
 Specialty Pharmacy Ongoing Clinical Assessment Note  Benjamin Brown is a 78 y.o. male who is being followed by the specialty pharmacy service for RxSp Oncology   Patient's specialty medication(s) reviewed today: Enzalutamide  (XTANDI )   Missed doses in the last 4 weeks: 0   Patient/Caregiver did not have any additional questions or concerns.   Therapeutic benefit summary: Patient is achieving benefit   Adverse events/side effects summary: Experienced adverse events/side effects (pt reports fatigue, tolerable)   Patient's therapy is appropriate to: Continue (Patient was on therapy in the past but stopped when insurance would not cover, restarting therapy.)    Goals Addressed             This Visit's Progress    Slow Disease Progression   On track    Patient is on track. Patient will maintain adherence.         Follow up:  3 months  Silvano LOISE Dolly Specialty Pharmacist

## 2024-01-27 NOTE — Progress Notes (Signed)
 Specialty Pharmacy Refill Coordination Note  BRANDIS MATSUURA is a 78 y.o. male contacted today regarding refills of specialty medication(s) Enzalutamide  (XTANDI )   Patient requested [facility] at [facility] at [place] date: 02/04/24   Medication will be filled on 02/03/24.

## 2024-02-03 ENCOUNTER — Other Ambulatory Visit: Payer: Self-pay

## 2024-02-24 ENCOUNTER — Other Ambulatory Visit: Payer: Self-pay

## 2024-02-24 ENCOUNTER — Other Ambulatory Visit: Payer: Self-pay | Admitting: Pharmacy Technician

## 2024-02-24 NOTE — Progress Notes (Signed)
 Specialty Pharmacy Refill Coordination Note  Benjamin Brown is a 78 y.o. male contacted today regarding refills of specialty medication(s) Enzalutamide [name]   Patient requested Delivery   Delivery date: 03/02/24   Verified address: [address]  [place]   Medication will be filled on 03/01/24.

## 2024-03-01 ENCOUNTER — Other Ambulatory Visit: Payer: Self-pay

## 2024-03-23 ENCOUNTER — Other Ambulatory Visit: Payer: Self-pay

## 2024-03-23 ENCOUNTER — Other Ambulatory Visit (HOSPITAL_COMMUNITY): Payer: Self-pay

## 2024-03-23 NOTE — Progress Notes (Signed)
 Specialty Pharmacy Ongoing Clinical Assessment Note  Benjamin Brown is a 78 y.o. male who is being followed by the specialty pharmacy service for RxSp Oncology   Patient's specialty medication(s) reviewed today: Enzalutamide [name]   Missed doses in the last 4 weeks: 0   Patient/Caregiver did not have any additional questions or concerns.   Therapeutic benefit summary: Patient is achieving benefit   Adverse events/side effects summary: Experienced adverse events/side effects (ongoing, tolerable fatigue)   Patient's therapy is appropriate to: Continue    Goals Addressed             This Visit's Progress    Slow Disease Progression   On track    Patient is on track. Patient will maintain adherence.  Patient was off of therapy due to insurance coverage.  Restarted and recent PSA was 0.7 ng/mL on 12/29/23, Dr. Mosetta Putt is monitoring.         Follow up:  3 months  Servando Snare Specialty Pharmacist

## 2024-03-23 NOTE — Progress Notes (Signed)
 Specialty Pharmacy Refill Coordination Note  Benjamin Brown is a 78 y.o. male contacted today regarding refills of specialty medication(s) Enzalutamide [name]   Patient requested Delivery   Delivery date: 04/02/24   Verified address: [address]  [place]    Medication will be filled on 04/01/24.

## 2024-04-01 ENCOUNTER — Telehealth: Payer: Self-pay | Admitting: Pharmacy Technician

## 2024-04-01 ENCOUNTER — Other Ambulatory Visit (HOSPITAL_COMMUNITY): Payer: Self-pay

## 2024-04-01 ENCOUNTER — Encounter: Payer: Self-pay | Admitting: Hematology

## 2024-04-01 ENCOUNTER — Other Ambulatory Visit: Payer: Self-pay | Admitting: Pharmacy Technician

## 2024-04-01 ENCOUNTER — Other Ambulatory Visit: Payer: Self-pay

## 2024-04-01 NOTE — Progress Notes (Signed)
 Pt's insurance is requiring him to use [facility] mail order. Ended his program for f/u refill calls. Will message the physician to send in a new prescription.

## 2024-04-01 NOTE — Progress Notes (Signed)
 Resolved. Pt must use [facility] Mail order now.

## 2024-04-01 NOTE — Telephone Encounter (Signed)
 Oral Oncology Patient Advocate Encounter   Received notification that patient has a high copay for Xtandi. It may be due for Cost exceeds max renewal.   Spoke with a rep at [facility]. It sounds like the pt is going to be required to do mail order through them, but he wasn't clear about that. He said they would still have to do a cost exceeds max over ride, but it would be #13 for 2 months.      Sherilyn Dacosta, CPhT Specialty Pharmacy Patient Advocate Phone: [phone] Fax: [phone]

## 2024-04-02 ENCOUNTER — Other Ambulatory Visit: Payer: Self-pay

## 2024-04-02 MED ORDER — ENZALUTAMIDE 80 MG PO TABS: 80.0000 mg | ORAL_TABLET | Freq: Every day | ORAL | 3 refills | Status: DC

## 2024-04-02 NOTE — Telephone Encounter (Signed)
 Script has been redirected to [facility] for processing and fulfillment. Will follow up with pharmacy on Monday, 04/05/24 to ensure script was received and to let patient know they can call and schedule delivery.    Ardeen Fillers, CPhT Pharmacy Technician Coordinator [facility] [phone] (Ph) 04/02/2024 2:59 PM

## 2024-04-08 ENCOUNTER — Telehealth: Payer: Self-pay | Admitting: Pharmacy Technician

## 2024-04-08 NOTE — Telephone Encounter (Signed)
 Called [facility] to check status of RX. It was sent to [facility], their specialty pharmacy. Verified they received the order contacted pt and the med was delivered yesterday.

## 2024-04-23 ENCOUNTER — Encounter: Payer: Self-pay | Admitting: Hematology

## 2024-04-29 ENCOUNTER — Other Ambulatory Visit: Payer: Self-pay

## 2024-04-29 ENCOUNTER — Inpatient Hospital Stay: Payer: Medicare Other | Attending: Hematology

## 2024-04-29 DIAGNOSIS — Z5111 Encounter for antineoplastic chemotherapy: Secondary | ICD-10-CM | POA: Diagnosis not present

## 2024-04-29 DIAGNOSIS — Z79899 Other long term (current) drug therapy: Secondary | ICD-10-CM | POA: Insufficient documentation

## 2024-04-29 DIAGNOSIS — C7951 Secondary malignant neoplasm of bone: Secondary | ICD-10-CM | POA: Insufficient documentation

## 2024-04-29 DIAGNOSIS — C61 Malignant neoplasm of prostate: Secondary | ICD-10-CM | POA: Insufficient documentation

## 2024-04-29 LAB — CBC WITH DIFFERENTIAL (CANCER CENTER ONLY)
Abs Immature Granulocytes: 0 10*3/uL (ref 0.00–0.07)
Basophils Absolute: 0 10*3/uL (ref 0.0–0.1)
Basophils Relative: 1 %
Eosinophils Absolute: 0.1 10*3/uL (ref 0.0–0.5)
Eosinophils Relative: 1 %
HCT: 38.4 % — ABNORMAL LOW (ref 39.0–52.0)
Hemoglobin: 12.8 g/dL — ABNORMAL LOW (ref 13.0–17.0)
Immature Granulocytes: 0 %
Lymphocytes Relative: 55 %
Lymphs Abs: 2.4 10*3/uL (ref 0.7–4.0)
MCH: 29.9 pg (ref 26.0–34.0)
MCHC: 33.3 g/dL (ref 30.0–36.0)
MCV: 89.7 fL (ref 80.0–100.0)
Monocytes Absolute: 0.3 10*3/uL (ref 0.1–1.0)
Monocytes Relative: 8 %
Neutro Abs: 1.5 10*3/uL — ABNORMAL LOW (ref 1.7–7.7)
Neutrophils Relative %: 35 %
Platelet Count: 192 10*3/uL (ref 150–400)
RBC: 4.28 MIL/uL (ref 4.22–5.81)
RDW: 13.3 % (ref 11.5–15.5)
WBC Count: 4.3 10*3/uL (ref 4.0–10.5)
nRBC: 0 % (ref 0.0–0.2)

## 2024-04-29 LAB — CMP (CANCER CENTER ONLY)
ALT: 12 U/L (ref 0–44)
AST: 16 U/L (ref 15–41)
Albumin: 3.8 g/dL (ref 3.5–5.0)
Alkaline Phosphatase: 126 U/L (ref 38–126)
Anion gap: 5 (ref 5–15)
BUN: 15 mg/dL (ref 8–23)
CO2: 29 mmol/L (ref 22–32)
Calcium: 9.1 mg/dL (ref 8.9–10.3)
Chloride: 106 mmol/L (ref 98–111)
Creatinine: 1.04 mg/dL (ref 0.61–1.24)
GFR, Estimated: >60 mL/min (ref >60)
Glucose, Bld: 99 mg/dL (ref 70–99)
Potassium: 4.4 mmol/L (ref 3.5–5.1)
Sodium: 140 mmol/L (ref 135–145)
Total Bilirubin: 0.6 mg/dL (ref 0.0–1.2)
Total Protein: 7.4 g/dL (ref 6.5–8.1)

## 2024-04-30 LAB — PROSTATE-SPECIFIC AG, SERUM (LABCORP): Prostate Specific Ag, Serum: 0.1 ng/mL (ref 0.0–4.0)

## 2024-04-30 LAB — TESTOSTERONE: Testosterone: 10 ng/dL — ABNORMAL LOW (ref 264–916)

## 2024-05-05 NOTE — Progress Notes (Unsigned)
  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 PSA of 71.9. Gleason score of 4+4=8 Germline: Somatic: Treamtent for metastatic disease:  1st line: ADT, from 2014 - 2018  2nd line: mCRPC. ADT with Eligard  30 mg every 4 months, and Xtandi  160 milligram daily started on October 2018. Pretreatment PSA 3.2 PSA <0.1 from 11/2017 to 04/2023. Insurance issue caused missing about 3-4 months. Resumed in January at 80 mg daily.   78 year old with mCRPC, previously with excellent response on Xtandi . A period of not taking Xtandi  due to insurance issues. Reports reduced fatigue off medication. PSA increased from 0.1 to 0.7. now down to 0.1.  Report fatigue from even lower dose. Discussed considering darolutamide and side effects. Discussed he may let us  know if would like to change.  Discussed restaging imaging and if oligometastases amenable for local therapy, can pursue. Therefore PSMA PET is reasonable. Studies showed improvement of survival in this approach. He will consider. Assessment & Plan Prostate cancer (HCC) Continue ADT and enzalutamide  80 mg daily. Secondary hypertension Amlodipine 5 mg daily At risk for side effect of medication Vit D 1000 international units  1200 mg calcium daily Heart healthy diet to prevent CVD, DM Follow up PCP routinely Walking and exercises daily  Orders Placed This Encounter  Procedures   CBC with Differential (Cancer Center Only)    Standing Status:   Future    Expiration Date:   05/06/2025   CMP (Cancer Center only)    Standing Status:   Future    Expiration Date:   05/06/2025   Prostate-Specific AG, Serum    Standing Status:   Future    Expiration Date:   05/06/2025   Testosterone     Standing Status:   Future    Expiration Date:   05/06/2025   I spent a total of 43  minutes including review of chart and various tests results, face-to-face time with the patient, discussions about results, plan of care and management of his cancer.    Follow up in about 4 months  Lowanda Ruddy, MD  INTERVAL HISTORY: Patient returns for follow-up. Report history of L4 abnormality while in the [employer]. He used to fly a lot. Some neuropathy. Pain comes and goes runs down the left LE. No change for years since got out of the [employer].  Fatigue is still present with 80 mg daily.  Hot flashes about the same daily sine 2014.  Report floaters seem more prominent  He is taking multivitamin daily.  Oncology History  Prostate cancer (HCC)  09/02/2013 Initial Diagnosis   Prostate cancer (HCC)      PHYSICAL EXAMINATION: ECOG PERFORMANCE STATUS: 0 - Asymptomatic  Vitals:   05/06/24 1138  BP: (!) 152/74  Pulse: 80  Resp: 15  Temp: (!) 97.2 F (36.2 C)  SpO2: 94%   Filed Weights   05/06/24 1138  Weight: 224 lb 8 oz (101.8 kg)    GENERAL: alert, no distress and comfortable NECK: No palpable mass LUNGS: clear to auscultation and percussion with normal breathing effort HEART: regular rate & rhythm  ABDOMEN: abdomen soft, non-tender and nondistended. Musculoskeletal: no edema  Relevant data reviewed during this visit included labs and imaging.

## 2024-05-06 ENCOUNTER — Inpatient Hospital Stay: Payer: Medicare Other

## 2024-05-06 ENCOUNTER — Inpatient Hospital Stay (HOSPITAL_BASED_OUTPATIENT_CLINIC_OR_DEPARTMENT_OTHER): Payer: Medicare Other

## 2024-05-06 VITALS — BP 140/80 | HR 66 | Temp 98.3°F | Resp 18

## 2024-05-06 VITALS — BP 152/74 | HR 80 | Temp 97.2°F | Resp 15 | Wt 224.5 lb

## 2024-05-06 DIAGNOSIS — Z9189 Other specified personal risk factors, not elsewhere classified: Secondary | ICD-10-CM | POA: Diagnosis not present

## 2024-05-06 DIAGNOSIS — Z79899 Other long term (current) drug therapy: Secondary | ICD-10-CM | POA: Diagnosis not present

## 2024-05-06 DIAGNOSIS — I159 Secondary hypertension, unspecified: Secondary | ICD-10-CM | POA: Diagnosis not present

## 2024-05-06 DIAGNOSIS — C7951 Secondary malignant neoplasm of bone: Secondary | ICD-10-CM | POA: Diagnosis not present

## 2024-05-06 DIAGNOSIS — C61 Malignant neoplasm of prostate: Secondary | ICD-10-CM

## 2024-05-06 DIAGNOSIS — Z5111 Encounter for antineoplastic chemotherapy: Secondary | ICD-10-CM | POA: Diagnosis not present

## 2024-05-06 DIAGNOSIS — I10 Essential (primary) hypertension: Secondary | ICD-10-CM | POA: Insufficient documentation

## 2024-05-06 MED ORDER — LEUPROLIDE ACETATE (4 MONTH) 30 MG ~~LOC~~ KIT
30.0000 mg | PACK | Freq: Once | SUBCUTANEOUS | Status: AC
  Administered 2024-05-06: 30 mg via SUBCUTANEOUS
  Filled 2024-05-06: qty 30

## 2024-05-06 NOTE — Assessment & Plan Note (Addendum)
 Amlodipine 5 mg daily

## 2024-05-06 NOTE — Assessment & Plan Note (Addendum)
 Continue ADT and enzalutamide  80 mg daily.

## 2024-05-06 NOTE — Assessment & Plan Note (Signed)
 Vit D 1000 international units  1200 mg calcium daily Heart healthy diet to prevent CVD, DM Follow up PCP routinely Walking and exercises daily

## 2024-05-07 ENCOUNTER — Encounter: Payer: Self-pay | Admitting: Hematology

## 2024-05-11 DIAGNOSIS — I10 Essential (primary) hypertension: Secondary | ICD-10-CM | POA: Diagnosis not present

## 2024-05-11 DIAGNOSIS — E663 Overweight: Secondary | ICD-10-CM | POA: Diagnosis not present

## 2024-05-11 DIAGNOSIS — E7800 Pure hypercholesterolemia, unspecified: Secondary | ICD-10-CM | POA: Diagnosis not present

## 2024-05-11 DIAGNOSIS — C61 Malignant neoplasm of prostate: Secondary | ICD-10-CM | POA: Diagnosis not present

## 2024-07-16 ENCOUNTER — Other Ambulatory Visit: Payer: Self-pay | Admitting: Hematology

## 2024-09-02 ENCOUNTER — Inpatient Hospital Stay: Attending: Hematology

## 2024-09-02 DIAGNOSIS — C7951 Secondary malignant neoplasm of bone: Secondary | ICD-10-CM | POA: Insufficient documentation

## 2024-09-02 DIAGNOSIS — Z5111 Encounter for antineoplastic chemotherapy: Secondary | ICD-10-CM | POA: Diagnosis not present

## 2024-09-02 DIAGNOSIS — C61 Malignant neoplasm of prostate: Secondary | ICD-10-CM | POA: Diagnosis not present

## 2024-09-02 DIAGNOSIS — E291 Testicular hypofunction: Secondary | ICD-10-CM | POA: Insufficient documentation

## 2024-09-02 LAB — CMP (CANCER CENTER ONLY)
ALT: 16 U/L (ref 0–44)
AST: 17 U/L (ref 15–41)
Albumin: 3.8 g/dL (ref 3.5–5.0)
Alkaline Phosphatase: 133 U/L — ABNORMAL HIGH (ref 38–126)
Anion gap: 5 (ref 5–15)
BUN: 18 mg/dL (ref 8–23)
CO2: 28 mmol/L (ref 22–32)
Calcium: 9.2 mg/dL (ref 8.9–10.3)
Chloride: 106 mmol/L (ref 98–111)
Creatinine: 1.14 mg/dL (ref 0.61–1.24)
GFR, Estimated: >60 mL/min (ref >60)
Glucose, Bld: 97 mg/dL (ref 70–99)
Potassium: 4.7 mmol/L (ref 3.5–5.1)
Sodium: 139 mmol/L (ref 135–145)
Total Bilirubin: 0.5 mg/dL (ref 0.0–1.2)
Total Protein: 7.5 g/dL (ref 6.5–8.1)

## 2024-09-02 LAB — CBC WITH DIFFERENTIAL (CANCER CENTER ONLY)
Abs Immature Granulocytes: 0.01 K/uL (ref 0.00–0.07)
Basophils Absolute: 0 K/uL (ref 0.0–0.1)
Basophils Relative: 0 %
Eosinophils Absolute: 0.1 K/uL (ref 0.0–0.5)
Eosinophils Relative: 2 %
HCT: 38.8 % — ABNORMAL LOW (ref 39.0–52.0)
Hemoglobin: 13.2 g/dL (ref 13.0–17.0)
Immature Granulocytes: 0 %
Lymphocytes Relative: 50 %
Lymphs Abs: 2.2 K/uL (ref 0.7–4.0)
MCH: 30.3 pg (ref 26.0–34.0)
MCHC: 34 g/dL (ref 30.0–36.0)
MCV: 89.2 fL (ref 80.0–100.0)
Monocytes Absolute: 0.4 K/uL (ref 0.1–1.0)
Monocytes Relative: 8 %
Neutro Abs: 1.8 K/uL (ref 1.7–7.7)
Neutrophils Relative %: 40 %
Platelet Count: 196 K/uL (ref 150–400)
RBC: 4.35 MIL/uL (ref 4.22–5.81)
RDW: 13.4 % (ref 11.5–15.5)
WBC Count: 4.5 K/uL (ref 4.0–10.5)
nRBC: 0 % (ref 0.0–0.2)

## 2024-09-03 LAB — TESTOSTERONE: Testosterone: 7 ng/dL — ABNORMAL LOW (ref 264–916)

## 2024-09-03 LAB — PROSTATE-SPECIFIC AG, SERUM (LABCORP): Prostate Specific Ag, Serum: 0.1 ng/mL (ref 0.0–4.0)

## 2024-09-04 NOTE — Progress Notes (Signed)
 [facility] OFFICE PROGRESS NOTE  Patient Care Team: Leigh Lung, MD as PCP - General (Family Medicine)  78 year old with mCRPC, previously with excellent response on Xtandi . A period of not taking Xtandi  due to insurance issues. Reports reduced fatigue off medication. PSA increased from 0.1 to 0.7. now down to 0.1.   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 PSA of 71.9. Gleason score of 4+4=8 Germline: Somatic: not yet Treamtent for metastatic disease:  1st line: ADT, from 2014 - 2018  2nd line: mCRPC. ADT with Eligard  30 mg every 4 months, and Xtandi  160 milligram daily started on October 2018. Pretreatment PSA 3.2 PSA <0.1 from 11/2017 to 04/2023. Insurance issue caused missing about 3-4 months. Resumed in January at 80 mg daily.   Report fatigue from even lower dose. Discussed considering darolutamide and side effects. Discussed he may let us  know if would like to change.  PSA stable at 0.1.  He is doing well.  Will continue monitor at this time.  Will receive Eligard  today. Assessment & Plan Prostate cancer (HCC) Continue ADT and enzalutamide  80 mg daily. On ADT every 4 month dosage. Received today Will need to order new PSA assay after 9/23 At risk for side effect of medication Vit D 1000 international units  1200 mg calcium daily Heart healthy diet to prevent CVD, DM Follow up PCP routinely Walking and exercises daily Last Bone density was normal in 11/2022   Pauletta JAYSON Chihuahua, MD  INTERVAL HISTORY: Patient returns for follow-up.  Overall doing well.  Tolerating half dose of enzalutamide .  Some fatigue.  He is able to walk.  No chest pain, shortness of breath, abdominal pain, other concerning symptoms.  Chronic cough that is unchanged.  No new bone pain or back pain.    PHYSICAL EXAMINATION: ECOG PERFORMANCE STATUS: 1 - Symptomatic but completely ambulatory  Vitals:   09/06/24 0850  BP:  (!) 145/74  Pulse: 64  Resp: 20  Temp: (!) 97.3 F (36.3 C)  SpO2: 99%   Filed Weights   09/06/24 0850  Weight: 223 lb 1.6 oz (101.2 kg)    GENERAL: alert, no distress and comfortable EYES: normal, sclera clear LUNGS: clear to auscultation and percussion with normal breathing effort HEART: regular rate & rhythm  ABDOMEN: abdomen soft, non-tender and nondistended. Musculoskeletal: no pitting edema  Relevant data reviewed during this visit included labs.

## 2024-09-04 NOTE — Assessment & Plan Note (Addendum)
 Continue ADT and enzalutamide  80 mg daily. On ADT every 4 month dosage. Received today Will need to order new PSA assay after 9/23

## 2024-09-04 NOTE — Assessment & Plan Note (Addendum)
 Vit D 1000 international units  1200 mg calcium daily Heart healthy diet to prevent CVD, DM Follow up PCP routinely Walking and exercises daily Last Bone density was normal in 11/2022

## 2024-09-06 ENCOUNTER — Inpatient Hospital Stay (HOSPITAL_BASED_OUTPATIENT_CLINIC_OR_DEPARTMENT_OTHER)

## 2024-09-06 ENCOUNTER — Inpatient Hospital Stay

## 2024-09-06 VITALS — BP 145/74 | HR 64 | Temp 97.3°F | Resp 20 | Wt 223.1 lb

## 2024-09-06 DIAGNOSIS — E291 Testicular hypofunction: Secondary | ICD-10-CM | POA: Diagnosis not present

## 2024-09-06 DIAGNOSIS — C61 Malignant neoplasm of prostate: Secondary | ICD-10-CM | POA: Diagnosis not present

## 2024-09-06 DIAGNOSIS — C7951 Secondary malignant neoplasm of bone: Secondary | ICD-10-CM | POA: Diagnosis not present

## 2024-09-06 DIAGNOSIS — Z9189 Other specified personal risk factors, not elsewhere classified: Secondary | ICD-10-CM

## 2024-09-06 DIAGNOSIS — Z5111 Encounter for antineoplastic chemotherapy: Secondary | ICD-10-CM | POA: Diagnosis not present

## 2024-09-06 MED ORDER — LEUPROLIDE ACETATE (4 MONTH) 30 MG ~~LOC~~ KIT
30.0000 mg | PACK | Freq: Once | SUBCUTANEOUS | Status: AC
  Administered 2024-09-06: 30 mg via SUBCUTANEOUS

## 2024-09-14 DIAGNOSIS — E6609 Other obesity due to excess calories: Secondary | ICD-10-CM | POA: Diagnosis not present

## 2024-09-14 DIAGNOSIS — C61 Malignant neoplasm of prostate: Secondary | ICD-10-CM | POA: Diagnosis not present

## 2024-09-14 DIAGNOSIS — I10 Essential (primary) hypertension: Secondary | ICD-10-CM | POA: Diagnosis not present

## 2024-10-25 ENCOUNTER — Other Ambulatory Visit: Payer: Self-pay

## 2024-12-20 ENCOUNTER — Encounter: Payer: Self-pay | Admitting: Hematology

## 2024-12-22 ENCOUNTER — Other Ambulatory Visit: Payer: Self-pay | Admitting: *Deleted

## 2024-12-22 DIAGNOSIS — C61 Malignant neoplasm of prostate: Secondary | ICD-10-CM

## 2024-12-24 ENCOUNTER — Other Ambulatory Visit: Payer: Self-pay

## 2024-12-24 DIAGNOSIS — C61 Malignant neoplasm of prostate: Secondary | ICD-10-CM

## 2024-12-24 NOTE — Progress Notes (Signed)
 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 A period of not taking Xtandi  due to insurance issues. Reports reduced fatigue off medication. PSA increased from 0.1 to 0.7. now down to 0.1.   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 PSA of 71.9. Gleason score of 4+4=8 Germline: Somatic: not yet Treamtent for metastatic disease:  1st line: ADT, from 2014 - 2018  2nd line: mCRPC. ADT with Eligard  30 mg every 4 months, and Xtandi  160 milligram daily started on October 2018. Pretreatment PSA 3.2 PSA <0.1 from 11/2017 to 04/2023. Insurance issue caused missing about 3-4 months. Resumed in January at 80 mg daily.   Report BP fine when checked with PCP.  Concerning for osteoporosis, will obtain bone density.  Continue weightbearing exercise and vitamin D/calcium. Assessment & Plan Prostate cancer (HCC) Continue ADT and enzalutamide  80 mg daily. On ADT every 4 month dosage. Received today Follow-up with lab and visit in about 4 months, and also with Eligard . Ordered lab including CBC, CMP, testosterone  and PSA for next visit Secondary hypertension Amlodipine 5 mg/benazepril 10 mg combo daily Report BP fine through PCP Age-related osteoporosis without current pathological fracture Bone density ordered.  Orders Placed This Encounter  Procedures   DG Bone Density    Standing Status:   Future    Expected Date:   01/27/2025    Expiration Date:   12/27/2025    Reason for Exam (SYMPTOM  OR DIAGNOSIS REQUIRED):   on ADT for prostate cancer    Preferred imaging location?:   [facility]   CBC with Differential (Cancer Center Only)    Standing Status:   Future    Expiration Date:   12/27/2025   CMP (Cancer Center only)    Standing Status:   Future    Expiration Date:    12/27/2025   Testosterone     Standing Status:   Future    Expiration Date:   12/27/2025   PSA    Standing Status:   Future    Expiration Date:   12/27/2025     Pauletta JAYSON Chihuahua, MD  INTERVAL HISTORY: Patient returns for follow-up. Overall doing well. Some hot flashes. No profound fatigue. Hard stools. He takes prunes and vegetables helps. No new medical problem. No chest pain, tightness or short of breath. No new bone pain.  Oncology History  Prostate cancer (HCC)  09/02/2013 Initial Diagnosis   Prostate cancer (HCC)      PHYSICAL EXAMINATION: ECOG PERFORMANCE STATUS: 1  Vitals:   12/27/24 1054 12/27/24 1055  BP: (!) 147/78 (!) 155/75  Pulse: 72   Resp: 17   Temp: (!) 97.3 F (36.3 C)   SpO2: 94%    Filed Weights   12/27/24 1054  Weight: 223 lb 14.4 oz (101.6 kg)    GENERAL: alert, no distress and comfortable SKIN: skin color normal and no jaundice or bruising or petechiae on exposed skin EYES: normal, sclera clear LUNGS: clear to auscultation and no wheeze or rales with normal breathing effort HEART: regular rate & rhythm  ABDOMEN: abdomen soft, non-tender and nondistended. Musculoskeletal: no edema   Relevant data reviewed during this visit included labs.  New labs ordered.

## 2024-12-27 ENCOUNTER — Inpatient Hospital Stay

## 2024-12-27 VITALS — BP 155/75 | HR 72 | Temp 97.3°F | Resp 17 | Wt 223.9 lb

## 2024-12-27 DIAGNOSIS — C7951 Secondary malignant neoplasm of bone: Secondary | ICD-10-CM | POA: Diagnosis present

## 2024-12-27 DIAGNOSIS — E895 Postprocedural testicular hypofunction: Secondary | ICD-10-CM | POA: Diagnosis not present

## 2024-12-27 DIAGNOSIS — I159 Secondary hypertension, unspecified: Secondary | ICD-10-CM | POA: Diagnosis not present

## 2024-12-27 DIAGNOSIS — C61 Malignant neoplasm of prostate: Secondary | ICD-10-CM

## 2024-12-27 DIAGNOSIS — M810 Age-related osteoporosis without current pathological fracture: Secondary | ICD-10-CM | POA: Diagnosis not present

## 2024-12-27 DIAGNOSIS — N5089 Other specified disorders of the male genital organs: Secondary | ICD-10-CM

## 2024-12-27 LAB — CBC WITH DIFFERENTIAL (CANCER CENTER ONLY)
Abs Immature Granulocytes: 0 K/uL (ref 0.00–0.07)
Basophils Absolute: 0 K/uL (ref 0.0–0.1)
Basophils Relative: 0 %
Eosinophils Absolute: 0.1 K/uL (ref 0.0–0.5)
Eosinophils Relative: 2 %
HCT: 41.1 % (ref 39.0–52.0)
Hemoglobin: 13.9 g/dL (ref 13.0–17.0)
Immature Granulocytes: 0 %
Lymphocytes Relative: 49 %
Lymphs Abs: 2.1 K/uL (ref 0.7–4.0)
MCH: 30.3 pg (ref 26.0–34.0)
MCHC: 33.8 g/dL (ref 30.0–36.0)
MCV: 89.7 fL (ref 80.0–100.0)
Monocytes Absolute: 0.3 K/uL (ref 0.1–1.0)
Monocytes Relative: 7 %
Neutro Abs: 1.8 K/uL (ref 1.7–7.7)
Neutrophils Relative %: 42 %
Platelet Count: 208 K/uL (ref 150–400)
RBC: 4.58 MIL/uL (ref 4.22–5.81)
RDW: 13.6 % (ref 11.5–15.5)
WBC Count: 4.3 K/uL (ref 4.0–10.5)
nRBC: 0 % (ref 0.0–0.2)

## 2024-12-27 LAB — CMP (CANCER CENTER ONLY)
ALT: 13 U/L (ref 0–44)
AST: 20 U/L (ref 15–41)
Albumin: 4.2 g/dL (ref 3.5–5.0)
Alkaline Phosphatase: 139 U/L — ABNORMAL HIGH (ref 38–126)
Anion gap: 9 (ref 5–15)
BUN: 13 mg/dL (ref 8–23)
CO2: 26 mmol/L (ref 22–32)
Calcium: 9.5 mg/dL (ref 8.9–10.3)
Chloride: 104 mmol/L (ref 98–111)
Creatinine: 0.96 mg/dL (ref 0.61–1.24)
GFR, Estimated: >60 mL/min
Glucose, Bld: 102 mg/dL — ABNORMAL HIGH (ref 70–99)
Potassium: 4.4 mmol/L (ref 3.5–5.1)
Sodium: 139 mmol/L (ref 135–145)
Total Bilirubin: 0.7 mg/dL (ref 0.0–1.2)
Total Protein: 7.9 g/dL (ref 6.5–8.1)

## 2024-12-27 LAB — PSA: Prostatic Specific Antigen: 0.17 ng/mL (ref 0.00–4.00)

## 2024-12-27 MED ORDER — LEUPROLIDE ACETATE (4 MONTH) 30 MG ~~LOC~~ KIT
30.0000 mg | PACK | Freq: Once | SUBCUTANEOUS | Status: AC
  Administered 2024-12-27: 30 mg via SUBCUTANEOUS
  Filled 2024-12-27: qty 30

## 2024-12-27 NOTE — Assessment & Plan Note (Addendum)
 Amlodipine 5 mg/benazepril 10 mg combo daily Report BP fine through PCP

## 2024-12-27 NOTE — Assessment & Plan Note (Addendum)
 Continue ADT and enzalutamide  80 mg daily. On ADT every 4 month dosage. Received today Follow-up with lab and visit in about 4 months, and also with Eligard . Ordered lab including CBC, CMP, testosterone  and PSA for next visit

## 2024-12-28 LAB — TESTOSTERONE: Testosterone: 8 ng/dL — ABNORMAL LOW (ref 264–916)

## 2024-12-31 ENCOUNTER — Ambulatory Visit: Payer: Self-pay

## 2024-12-31 ENCOUNTER — Ambulatory Visit (HOSPITAL_COMMUNITY): Admission: RE | Admit: 2024-12-31 | Discharge: 2024-12-31 | Disposition: A | Source: Ambulatory Visit

## 2024-12-31 ENCOUNTER — Encounter: Payer: Self-pay | Admitting: Hematology

## 2024-12-31 DIAGNOSIS — M810 Age-related osteoporosis without current pathological fracture: Secondary | ICD-10-CM | POA: Diagnosis present

## 2024-12-31 DIAGNOSIS — C61 Malignant neoplasm of prostate: Secondary | ICD-10-CM | POA: Diagnosis present

## 2025-01-03 ENCOUNTER — Encounter: Payer: Self-pay | Admitting: Hematology

## 2025-01-03 NOTE — Telephone Encounter (Signed)
 Spoke with the patient made him aware of Dr Fredrik note and instructions. Patient asked about his PSA informed him of the result. Assured Dr Tina will have me call if he has any issues. Arland Legions BSN RN

## 2025-01-03 NOTE — Telephone Encounter (Signed)
-----   Message from Pauletta Chihuahua, MD sent at 12/31/2024  5:02 PM EST ----- Arland, please let him know good news bone density was normal.  Continue vitamin D and calcium daily.  Thank you.

## 2025-04-26 ENCOUNTER — Inpatient Hospital Stay
